# Patient Record
Sex: Male | Born: 1980 | Hispanic: Yes | Marital: Married | Smoking: Never smoker
Health system: Southern US, Community
[De-identification: ages and names within clinical notes are randomized; demographics above are authoritative.]

## PROBLEM LIST (undated history)

## (undated) DIAGNOSIS — K8020 Calculus of gallbladder without cholecystitis without obstruction: Secondary | ICD-10-CM

## (undated) HISTORY — DX: Calculus of gallbladder without cholecystitis without obstruction: K80.20

## (undated) HISTORY — PX: NO PAST SURGERIES: SHX2092

---

## 2018-09-21 LAB — HM HIV SCREENING LAB: HM HIV Screening: NEGATIVE

## 2019-01-19 ENCOUNTER — Ambulatory Visit: Payer: Self-pay

## 2019-01-19 ENCOUNTER — Other Ambulatory Visit: Payer: Self-pay

## 2019-02-05 ENCOUNTER — Other Ambulatory Visit: Payer: Self-pay

## 2019-02-05 ENCOUNTER — Ambulatory Visit: Payer: Self-pay | Admitting: Nurse Practitioner

## 2019-02-05 DIAGNOSIS — Z202 Contact with and (suspected) exposure to infections with a predominantly sexual mode of transmission: Secondary | ICD-10-CM

## 2019-02-05 MED ORDER — METRONIDAZOLE 500 MG PO TABS: 2000.0000 mg | ORAL_TABLET | Freq: Once | ORAL | 0 refills | Status: AC

## 2019-02-05 MED ORDER — METRONIDAZOLE 500 MG PO TABS: 2000.0000 mg | ORAL_TABLET | Freq: Once | ORAL | Status: DC

## 2019-02-05 NOTE — Progress Notes (Signed)
STI clinic/screening visit  Subjective:  Maxwell Stephens is a 38 y.o. male being seen today for an STI screening visit. The patient reports they do not have symptoms.  Patient has the following medical conditions:  There are no active problems to display for this patient.    Chief Complaint  Patient presents with  . Exposure to STD    exposure to [place] from pregnant wife    Referred by wife who is pregnant - unsure of what she was treated for - went back home to obtain card for treatment - exposure to Trich  Any questions or concerns before we get started - at length discussion with client concerning the importance of know the actual treatment and exposure.  Client asked the following questions for STD screening: however declines to answer questions at this time - states that he just wants treatment  Allergies: NKDA Contact to STD - Trich Last sex  - denies having sex with wife       Patient reports - contact of Trich  See flowsheet for further details and programmatic requirements.    The following portions of the patient's history were reviewed and updated as appropriate: allergies, current medications, past medical history, past social history, past surgical history and problem list.  Objective:  There were no vitals filed for this visit.  Physical Exam Vitals signs reviewed.  Constitutional:      Appearance: Normal appearance. He is well-developed and normal weight.  Pulmonary:     Effort: Pulmonary effort is normal.  Skin:    General: Skin is warm and dry.  Neurological:     Mental Status: He is alert.  Psychiatric:        Behavior: Behavior is cooperative.       Assessment and Plan:  Maxwell Stephens is a 38 y.o. male presenting to the [facility] Department for treatment as contact to [place]  1. Trichomonas contact - brought in information concerning Trich exposure Client given  - metroNIDAZOLE (FLAGYL) tablet 2,000 mg Advised to not have sex  for 7 days after partner treated Advised no ETOH while taking medication - also advised to take with food. Client verbalizes understanding and is in agreement with plan of care  Return for PRN.  No future appointments.  Berniece Andreas, NP

## 2019-07-30 NOTE — Progress Notes (Signed)
Chart reviewed by Pharmacist  Suzanne Walker PharmD, Contract Pharmacist at [facility]  

## 2019-08-29 ENCOUNTER — Ambulatory Visit: Payer: Self-pay | Attending: Internal Medicine

## 2019-08-29 DIAGNOSIS — Z23 Encounter for immunization: Secondary | ICD-10-CM

## 2019-08-29 NOTE — Progress Notes (Signed)
   Covid-19 Vaccination Clinic  Name:  Lowen Barringer    MRN: [id number] DOB: [birth year]  08/29/2019  Mr. Mans was observed post Covid-19 immunization for 15 minutes without incident. He was provided with Vaccine Information Sheet and instruction to access the V-Safe system.   Mr. Wilkie was instructed to call 911 with any severe reactions post vaccine: Marland Kitchen Difficulty breathing  . Swelling of face and throat  . A fast heartbeat  . A bad rash all over body  . Dizziness and weakness   Immunizations Administered    Name Date Dose VIS Date Route   Pfizer COVID-19 Vaccine 08/29/2019 10:52 AM 0.3 mL 05/21/2019 Intramuscular   Manufacturer: [employer], [employer]   Lot: [id number]   NDC: [address]-6

## 2019-09-19 ENCOUNTER — Ambulatory Visit: Payer: Self-pay | Attending: Internal Medicine

## 2019-09-19 DIAGNOSIS — Z23 Encounter for immunization: Secondary | ICD-10-CM

## 2019-09-19 NOTE — Progress Notes (Signed)
   Covid-19 Vaccination Clinic  Name:  Maxwell Stephens    MRN: [id number] DOB: 1981-04-16  09/19/2019  [name] was observed post Covid-19 immunization for 15 minutes without incident. He was provided with Vaccine Information Sheet and instruction to access the V-Safe system.   [name] was instructed to call 911 with any severe reactions post vaccine: Marland Kitchen Difficulty breathing  . Swelling of face and throat  . A fast heartbeat  . A bad rash all over body  . Dizziness and weakness   Immunizations Administered    Name Date Dose VIS Date Route   Pfizer COVID-19 Vaccine 09/19/2019 10:22 AM 0.3 mL 05/21/2019 Intramuscular   Manufacturer: [employer], [employer]   Lot: [id number]   NDC: [address]-9

## 2020-04-16 ENCOUNTER — Other Ambulatory Visit: Payer: Self-pay

## 2020-04-16 ENCOUNTER — Emergency Department
Admission: EM | Admit: 2020-04-16 | Discharge: 2020-04-16 | Disposition: A | Discharge status: Home / Self Care | Payer: Self-pay | Attending: Emergency Medicine | Admitting: Emergency Medicine

## 2020-04-16 ENCOUNTER — Emergency Department: Payer: Self-pay

## 2020-04-16 DIAGNOSIS — K8020 Calculus of gallbladder without cholecystitis without obstruction: Secondary | ICD-10-CM | POA: Insufficient documentation

## 2020-04-16 DIAGNOSIS — K760 Fatty (change of) liver, not elsewhere classified: Secondary | ICD-10-CM | POA: Insufficient documentation

## 2020-04-16 DIAGNOSIS — E8889 Other specified metabolic disorders: Secondary | ICD-10-CM

## 2020-04-16 DIAGNOSIS — E7889 Other lipoprotein metabolism disorders: Secondary | ICD-10-CM

## 2020-04-16 DIAGNOSIS — R1011 Right upper quadrant pain: Secondary | ICD-10-CM

## 2020-04-16 LAB — HEPATIC FUNCTION PANEL
ALT: 59 U/L — ABNORMAL HIGH (ref 0–44)
AST: 24 U/L (ref 15–41)
Albumin: 3.8 g/dL (ref 3.5–5.0)
Alkaline Phosphatase: 75 U/L (ref 38–126)
Bilirubin, Direct: <0.1 mg/dL (ref 0.0–0.2)
Total Bilirubin: 0.7 mg/dL (ref 0.3–1.2)
Total Protein: 6.7 g/dL (ref 6.5–8.1)

## 2020-04-16 LAB — BASIC METABOLIC PANEL
Anion gap: 8 (ref 5–15)
BUN: 24 mg/dL — ABNORMAL HIGH (ref 6–20)
CO2: 25 mmol/L (ref 22–32)
Calcium: 8.8 mg/dL — ABNORMAL LOW (ref 8.9–10.3)
Chloride: 105 mmol/L (ref 98–111)
Creatinine, Ser: 0.84 mg/dL (ref 0.61–1.24)
GFR, Estimated: >60 mL/min (ref >60)
Glucose, Bld: 104 mg/dL — ABNORMAL HIGH (ref 70–99)
Potassium: 3.7 mmol/L (ref 3.5–5.1)
Sodium: 138 mmol/L (ref 135–145)

## 2020-04-16 LAB — URINALYSIS, COMPLETE (UACMP) WITH MICROSCOPIC
Bacteria, UA: NONE SEEN
Bilirubin Urine: NEGATIVE
Glucose, UA: NEGATIVE mg/dL
Hgb urine dipstick: NEGATIVE
Ketones, ur: NEGATIVE mg/dL
Leukocytes,Ua: NEGATIVE
Nitrite: NEGATIVE
Protein, ur: NEGATIVE mg/dL
Specific Gravity, Urine: 1.017 (ref 1.005–1.030)
Squamous Epithelial / HPF: NONE SEEN (ref 0–5)
pH: 5 (ref 5.0–8.0)

## 2020-04-16 LAB — LIPASE, BLOOD: Lipase: 32 U/L (ref 11–51)

## 2020-04-16 LAB — CBC
HCT: 44.7 % (ref 39.0–52.0)
Hemoglobin: 14.9 g/dL (ref 13.0–17.0)
MCH: 29 pg (ref 26.0–34.0)
MCHC: 33.3 g/dL (ref 30.0–36.0)
MCV: 87.1 fL (ref 80.0–100.0)
Platelets: 247 10*3/uL (ref 150–400)
RBC: 5.13 MIL/uL (ref 4.22–5.81)
RDW: 12.7 % (ref 11.5–15.5)
WBC: 9.3 10*3/uL (ref 4.0–10.5)
nRBC: 0 % (ref 0.0–0.2)

## 2020-04-16 MED ORDER — PANTOPRAZOLE SODIUM 40 MG PO TBEC
40.0000 mg | DELAYED_RELEASE_TABLET | Freq: Once | ORAL | Status: AC
  Administered 2020-04-16: 40 mg via ORAL
  Filled 2020-04-16: qty 1

## 2020-04-16 MED ORDER — ALUM & MAG HYDROXIDE-SIMETH 200-200-20 MG/5ML PO SUSP
30.0000 mL | Freq: Once | ORAL | Status: AC
  Administered 2020-04-16: 30 mL via ORAL
  Filled 2020-04-16: qty 30

## 2020-04-16 NOTE — ED Notes (Signed)
Paper consent signed for discharge. 

## 2020-04-16 NOTE — ED Notes (Signed)
Ultrasound at besdide.

## 2020-04-16 NOTE — ED Notes (Signed)
EDP at bedside  

## 2020-04-16 NOTE — ED Notes (Signed)
Pt presents with abdo pain R side and R back, nonradiating. States that has pain after eats and feels acid reflux and "a ball" on R side abdo. Usually has BM daily. LBM was this morning. Denies pain, burning with urination. Denies N/V/D. Pt in NAD at this time.

## 2020-04-16 NOTE — ED Provider Notes (Signed)
[facility] Emergency Department Provider Note  ____________________________________________   First MD Initiated Contact with Patient 04/16/20 [phone]     (approximate)  I have reviewed the triage vital signs and the nursing notes.   HISTORY  Chief Complaint Flank Pain   HPI Maxwell Stephens is a 39 y.o. male without significant past medical history who presents for assessment of intermittent episodes of right upper quadrant Donnell pain rating to his back and right shoulder that have been present over the past year but seemingly becoming more frequent.  Patient states he feels episodes are worse after meals.  He denies any NSAID use or regular EtOH use.  He states he is coming emergency room not because his symptoms have changed over the last couple of days but he feels they've been getting more frequent over the past couple of months and not improving.  He states he feels a little bit weak now and then but denies any other acute focal symptoms including earache, vision changes, vertigo, chest pain, cough, shortness of breath, vomiting, diarrhea, dysuria, rash, focal extremity weakness numbness or tingling, recent traumatic injuries, or other acute complaints.  No prior similar episodes.  No alleviating aggravating factors.         No past medical history on file.  There are no problems to display for this patient.     Prior to Admission medications   Medication Sig Start Date End Date Taking? Authorizing Provider  cyclobenzaprine (FLEXERIL) 10 MG tablet cyclobenzaprine 10 mg tablet  Take 1 tablet 3 times a day by oral route.    [provider]  methocarbamol (ROBAXIN) 500 MG tablet Robaxin 500 mg tablet  1 PO BID    [provider]  naproxen (NAPROSYN) 500 MG tablet naproxen 500 mg tablet    [provider]  traMADol (ULTRAM) 50 MG tablet tramadol 50 mg tablet  Take 1 tablet every 6 hours by oral route.    [provider]    Allergies Other  No family history on file.  Social History Social History   Tobacco Use  . Smoking status: Not on file  Substance Use Topics  . Alcohol use: Not on file  . Drug use: Not on file    Review of Systems  Review of Systems  Constitutional: Negative for chills and fever.  HENT: Negative for sore throat.   Eyes: Negative for pain.  Respiratory: Negative for cough and stridor.   Cardiovascular: Negative for chest pain.  Gastrointestinal: Positive for abdominal pain. Negative for blood in stool, diarrhea and vomiting.  Genitourinary: Negative for dysuria.  Musculoskeletal: Negative for myalgias.  Skin: Negative for rash.  Neurological: Negative for seizures, loss of consciousness and headaches.  Psychiatric/Behavioral: Negative for suicidal ideas.  All other systems reviewed and are negative.     ____________________________________________   PHYSICAL EXAM:  VITAL SIGNS: ED Triage Vitals  Enc Vitals Group     BP 04/16/20 0406 (!) 145/84     Pulse Rate 04/16/20 0406 (!) 56     Resp 04/16/20 0406 16     Temp 04/16/20 0406 98 F (36.7 C)     Temp Source 04/16/20 0406 Oral     SpO2 04/16/20 0406 96 %     Weight 04/16/20 0405 165 lb (74.8 kg)     Height 04/16/20 0405 5\' 5"  (1.651 m)     Head Circumference --      Peak Flow --      Pain  Score --      Pain Loc --      Pain Edu? --      Excl. in GC? --    Vitals:   04/16/20 0406 04/16/20 0809  BP: (!) 145/84 133/88  Pulse: (!) 56 62  Resp: 16 16  Temp: 98 F (36.7 C)   SpO2: 96% 100%   Physical Exam Vitals and nursing note reviewed.  Constitutional:      Appearance: He is well-developed.  HENT:     Head: Normocephalic and atraumatic.     Right Ear: External ear normal.     Left Ear: External ear normal.     Nose: Nose normal.     Mouth/Throat:     Mouth: Mucous membranes are moist.  Eyes:     Conjunctiva/sclera: Conjunctivae normal.  Cardiovascular:     Rate and Rhythm: Normal  rate and regular rhythm.     Heart sounds: No murmur heard.   Pulmonary:     Effort: Pulmonary effort is normal. No respiratory distress.     Breath sounds: Normal breath sounds.  Abdominal:     Palpations: Abdomen is soft.     Tenderness: There is no abdominal tenderness.  Musculoskeletal:     Cervical back: Neck supple.  Skin:    General: Skin is warm and dry.  Neurological:     Mental Status: He is alert and oriented to person, place, and time.  Psychiatric:        Mood and Affect: Mood normal.      ____________________________________________   LABS (all labs ordered are listed, but only abnormal results are displayed)  Labs Reviewed  URINALYSIS, COMPLETE (UACMP) WITH MICROSCOPIC - Abnormal; Notable for the following components:      Result Value   Color, Urine YELLOW (*)    APPearance CLEAR (*)    All other components within normal limits  BASIC METABOLIC PANEL - Abnormal; Notable for the following components:   Glucose, Bld 104 (*)    BUN 24 (*)    Calcium 8.8 (*)    All other components within normal limits  HEPATIC FUNCTION PANEL - Abnormal; Notable for the following components:   ALT 59 (*)    All other components within normal limits  CBC  LIPASE, BLOOD  H. PYLORI ANTIGEN, STOOL   ____________________________________________  EKG  Sinus bradycardia with a ventricular rate of 52, axis deviation and no evidence of acute ischemia or other significant underlying arrhythmia.  Unremarkable intervals. ____________________________________________  RADIOLOGY  ED MD interpretation: Cholelithiasis without evidence of cholecystitis or dilation of the common bile duct.  Official radiology report(s): US ABDOMEN LIMITED RUQ (LIVER/GB)  Result Date: 04/16/2020 CLINICAL DATA:  Right upper quadrant pain for 2 days. EXAM: ULTRASOUND ABDOMEN LIMITED RIGHT UPPER QUADRANT COMPARISON:  None. FINDINGS: Gallbladder: Multiple shadowing gallstones measuring up to 1.8 cm. No  gallbladder wall thickening. No sonographic Murphy sign noted by sonographer. Common bile duct: Diameter: 0.5 cm, within normal limits Liver: No focal lesion identified. Liver parenchymal echogenicity is diffusely increased. Portal vein is patent on color Doppler imaging with normal direction of blood flow towards the liver. Other: None. IMPRESSION: 1.  Cholelithiasis without evidence of acute cholecystitis. 2. Diffusely increased liver parenchymal echogenicity which is a nonspecific finding, most commonly seen with hepatic steatosis. Electronically Signed   By: Emmaline Kluver M.D.   On: 04/16/2020 09:14    ____________________________________________   PROCEDURES  Procedure(s) performed (including Critical Care):  Procedures   ____________________________________________  INITIAL IMPRESSION / ASSESSMENT AND PLAN / ED COURSE         Patient presents with [place] to history exam for assessment of him and episodes of right upper quadrant pain that have been increasing in frequency and are associated with meals.  No other clear alleviating or aggravating factors.  Patient is afebrile hemodynamically stable arrival.  Low suspicion for ACS given description of symptoms and patient denying any chest pain with reassuring EKG.  Lipase not consistent with acute pancreatitis.  There is no blood in the urine and description of symptoms is otherwise not consistent with acute symptomatic nephrolithiasis.  No fever or CVA tenderness or other urinary symptoms to suggest pyelonephritis.  Right upper quadrant ultrasound with evidence of cholelithiasis without evidence of Coley cystitis or choledocholithiasis.  Symptoms are very consistent with peptic ulcer disease versus gastritis versus symptomatic cholelithiasis.  I discussed this with the patient and discussed recommendation for outpatient follow-up with general surgery who may recommend a cholecystectomy.  Also discussed that patient must follow-up with his  primary care provider as he may be having some gastritis and peptic ulcer disease as well and his primary care doctor can follow-up his H. pylori test.  I suspect patient is more likely having symptomatic cholelithiasis than peptic ulcer disease given stones on right upper quadrant ultrasound.  H. pylori studies sent.  Patient given Protonix and Maalox.  Advised patient to follow-up with PCP.  Patient discharged in stable condition.  Strict precautions advised and discussed.   ____________________________________________   FINAL CLINICAL IMPRESSION(S) / ED DIAGNOSES  Final diagnoses:  RUQ pain  Calculus of gallbladder without cholecystitis without obstruction  Steatosis    Medications  alum & mag hydroxide-simeth (MAALOX/MYLANTA) 200-200-20 MG/5ML suspension 30 mL (30 mLs Oral Given 04/16/20 0843)  pantoprazole (PROTONIX) EC tablet 40 mg (40 mg Oral Given 04/16/20 0843)     ED Discharge Orders    None       Note:  This document was prepared using Dragon voice recognition software and may include unintentional dictation errors.   Gilles Chiquito, MD 04/16/20 [phone]

## 2020-04-16 NOTE — ED Triage Notes (Signed)
Patient reports right flank pain that radiates into abdomen.

## 2020-04-19 LAB — H. PYLORI ANTIGEN, STOOL: H. Pylori Stool Ag, Eia: POSITIVE — AB

## 2020-05-01 ENCOUNTER — Ambulatory Visit: Payer: Self-pay | Admitting: Surgery

## 2020-06-04 ENCOUNTER — Emergency Department
Admission: EM | Admit: 2020-06-04 | Discharge: 2020-06-04 | Disposition: A | Discharge status: Home / Self Care | Payer: Self-pay | Attending: Emergency Medicine | Admitting: Emergency Medicine

## 2020-06-04 ENCOUNTER — Other Ambulatory Visit: Payer: Self-pay

## 2020-06-04 ENCOUNTER — Encounter: Payer: Self-pay | Admitting: Emergency Medicine

## 2020-06-04 ENCOUNTER — Emergency Department: Payer: Self-pay

## 2020-06-04 DIAGNOSIS — R1011 Right upper quadrant pain: Secondary | ICD-10-CM | POA: Insufficient documentation

## 2020-06-04 DIAGNOSIS — K8051 Calculus of bile duct without cholangitis or cholecystitis with obstruction: Secondary | ICD-10-CM | POA: Insufficient documentation

## 2020-06-04 DIAGNOSIS — K8050 Calculus of bile duct without cholangitis or cholecystitis without obstruction: Secondary | ICD-10-CM

## 2020-06-04 DIAGNOSIS — R110 Nausea: Secondary | ICD-10-CM | POA: Insufficient documentation

## 2020-06-04 DIAGNOSIS — R1010 Upper abdominal pain, unspecified: Secondary | ICD-10-CM

## 2020-06-04 LAB — COMPREHENSIVE METABOLIC PANEL
ALT: 61 U/L — ABNORMAL HIGH (ref 0–44)
AST: 30 U/L (ref 15–41)
Albumin: 3.9 g/dL (ref 3.5–5.0)
Alkaline Phosphatase: 57 U/L (ref 38–126)
Anion gap: 10 (ref 5–15)
BUN: 23 mg/dL — ABNORMAL HIGH (ref 6–20)
CO2: 24 mmol/L (ref 22–32)
Calcium: 8.9 mg/dL (ref 8.9–10.3)
Chloride: 102 mmol/L (ref 98–111)
Creatinine, Ser: 0.76 mg/dL (ref 0.61–1.24)
GFR, Estimated: >60 mL/min (ref >60)
Glucose, Bld: 154 mg/dL — ABNORMAL HIGH (ref 70–99)
Potassium: 3.6 mmol/L (ref 3.5–5.1)
Sodium: 136 mmol/L (ref 135–145)
Total Bilirubin: 0.7 mg/dL (ref 0.3–1.2)
Total Protein: 7.2 g/dL (ref 6.5–8.1)

## 2020-06-04 LAB — CBC
HCT: 45.1 % (ref 39.0–52.0)
Hemoglobin: 15.2 g/dL (ref 13.0–17.0)
MCH: 28.9 pg (ref 26.0–34.0)
MCHC: 33.7 g/dL (ref 30.0–36.0)
MCV: 85.7 fL (ref 80.0–100.0)
Platelets: 250 10*3/uL (ref 150–400)
RBC: 5.26 MIL/uL (ref 4.22–5.81)
RDW: 12 % (ref 11.5–15.5)
WBC: 10.8 10*3/uL — ABNORMAL HIGH (ref 4.0–10.5)
nRBC: 0 % (ref 0.0–0.2)

## 2020-06-04 LAB — LIPASE, BLOOD: Lipase: 27 U/L (ref 11–51)

## 2020-06-04 MED ORDER — ONDANSETRON HCL 4 MG/2ML IJ SOLN
4.0000 mg | Freq: Once | INTRAMUSCULAR | Status: AC
  Administered 2020-06-04: 04:00:00 4 mg via INTRAVENOUS
  Filled 2020-06-04: qty 2

## 2020-06-04 MED ORDER — HYDROCODONE-ACETAMINOPHEN 5-325 MG PO TABS
1.0000 | ORAL_TABLET | ORAL | 0 refills | Status: DC | PRN
Start: 2020-06-04 — End: 2020-10-12

## 2020-06-04 MED ORDER — KETOROLAC TROMETHAMINE 30 MG/ML IJ SOLN
30.0000 mg | Freq: Once | INTRAMUSCULAR | Status: AC
  Administered 2020-06-04: 04:00:00 30 mg via INTRAVENOUS
  Filled 2020-06-04: qty 1

## 2020-06-04 NOTE — ED Triage Notes (Signed)
Pt arrived via POV with reports of abd pain that started around 7pm, pt unable to sit still in triage, pt states he has been taking OTC meds for colon health with no relief.  Pt here at the beginning of Nov dx cholelithiasis, pt states pain is worse.  Pt also reports some constipation as well, had small BM today but states it was difficult to come out.

## 2020-06-04 NOTE — ED Provider Notes (Signed)
[facility] Emergency Department Provider Note   ____________________________________________    I have reviewed the triage vital signs and the nursing notes.   HISTORY  Chief Complaint Abdominal Pain     HPI Maxwell Stephens is a 39 y.o. male who presents with complaints of right upper quadrant abdominal pain.  Patient reports he has had this before and was related to his gallbladder.  He describes sharp pain with some radiation to his back.  Positive nausea no vomiting.  No fevers or chills.  Has not taken anything for this.  No history of abdominal surgery  Past Medical History:  Diagnosis Date   Cholelithiasis     There are no problems to display for this patient.   History reviewed. No pertinent surgical history.  Prior to Admission medications   Medication Sig Start Date End Date Taking? Authorizing Provider  cyclobenzaprine (FLEXERIL) 10 MG tablet cyclobenzaprine 10 mg tablet  Take 1 tablet 3 times a day by oral route.    [provider]  HYDROcodone-acetaminophen (NORCO/VICODIN) 5-325 MG tablet Take 1 tablet by mouth every 4 (four) hours as needed for moderate pain. 06/04/20   Jene Every, MD  methocarbamol (ROBAXIN) 500 MG tablet Robaxin 500 mg tablet  1 PO BID    [provider]  naproxen (NAPROSYN) 500 MG tablet naproxen 500 mg tablet    [provider]  traMADol (ULTRAM) 50 MG tablet tramadol 50 mg tablet  Take 1 tablet every 6 hours by oral route.    [provider]     Allergies Other  No family history on file.  Social History Social History   Tobacco Use   Smoking status: Never Smoker   Smokeless tobacco: Never Used  Building services engineer Use: Never used    Review of Systems  Constitutional: No fever/chills Eyes: No visual changes.  ENT: No sore throat. Cardiovascular: Denies chest pain. Respiratory: Denies shortness of breath. Gastrointestinal: As above Genitourinary:  Negative for dysuria. Musculoskeletal: Negative for back pain. Skin: Negative for rash. Neurological: Negative for headaches or weakness   ____________________________________________   PHYSICAL EXAM:  VITAL SIGNS: ED Triage Vitals  Enc Vitals Group     BP 06/04/20 0237 135/78     Pulse Rate 06/04/20 0237 64     Resp 06/04/20 0237 (!) 22     Temp 06/04/20 0237 98.6 F (37 C)     Temp Source 06/04/20 0237 Oral     SpO2 06/04/20 0237 98 %     Weight 06/04/20 0232 74.8 kg (164 lb 14.5 oz)     Height 06/04/20 0232 1.651 m (5\' 5" )     Head Circumference --      Peak Flow --      Pain Score 06/04/20 0232 10     Pain Loc --      Pain Edu? --      Excl. in GC? --     Constitutional: Alert and oriented  Nose: No congestion/rhinnorhea. Mouth/Throat: Mucous membranes are moist.   Neck:  Painless ROM Cardiovascular: Normal rate, regular rhythm. Grossly normal heart sounds.  Good peripheral circulation. Respiratory: Normal respiratory effort.  No retractions. Lungs CTAB. Gastrointestinal: Soft, mild tenderness right upper quadrant, no distention, no CVA tenderness  Musculoskeletal: No lower extremity tenderness nor edema.  Warm and well perfused Neurologic:  Normal speech and language. No gross focal neurologic deficits are appreciated.  Skin:  Skin is warm, dry and intact. No rash noted.  Psychiatric: Mood and affect are normal. Speech and behavior are normal.  ____________________________________________   LABS (all labs ordered are listed, but only abnormal results are displayed)  Labs Reviewed  COMPREHENSIVE METABOLIC PANEL - Abnormal; Notable for the following components:      Result Value   Glucose, Bld 154 (*)    BUN 23 (*)    ALT 61 (*)    All other components within normal limits  CBC - Abnormal; Notable for the following components:   WBC 10.8 (*)    All other components within normal limits  LIPASE, BLOOD  URINALYSIS, COMPLETE (UACMP) WITH MICROSCOPIC    ____________________________________________  EKG  ED ECG REPORT I, Jene Every, the attending physician, personally viewed and interpreted this ECG.  Date: 06/04/2020  Rhythm: normal sinus rhythm QRS Axis: normal Intervals: normal ST/T Wave abnormalities: normal Narrative Interpretation: no evidence of acute ischemia  ____________________________________________  RADIOLOGY  Ultrasound reviewed by me, large gallstone noted ____________________________________________   PROCEDURES  Procedure(s) performed: No  Procedures   Critical Care performed: No ____________________________________________   INITIAL IMPRESSION / ASSESSMENT AND PLAN / ED COURSE  Pertinent labs & imaging results that were available during my care of the patient were reviewed by me and considered in my medical decision making (see chart for details).  Patient with ruq abdominal pain with mild ttp. Suspicious for biliary colic vs cholecystitis. Labs overall reassuring. Will treat with IV toradol/IV zofran and obtain US   [place] without evidence of cholecystitis. Multiple gallstones noted.   Patient pain free after toradol.   Appropriate for d/c at this time with outpatient follow up with gen surgery    ____________________________________________   FINAL CLINICAL IMPRESSION(S) / ED DIAGNOSES  Final diagnoses:  Upper abdominal pain  Biliary colic        Note:  This document was prepared using Dragon voice recognition software and may include unintentional dictation errors.   Jene Every, MD 06/04/20 [phone]

## 2020-06-13 ENCOUNTER — Ambulatory Visit: Payer: Self-pay | Admitting: Surgery

## 2020-06-15 ENCOUNTER — Ambulatory Visit: Payer: Self-pay | Admitting: Surgery

## 2020-09-05 ENCOUNTER — Ambulatory Visit: Payer: Self-pay | Admitting: Surgery

## 2020-09-27 ENCOUNTER — Ambulatory Visit (INDEPENDENT_AMBULATORY_CARE_PROVIDER_SITE_OTHER): Payer: Managed Care, Other (non HMO) | Admitting: Surgery

## 2020-09-27 ENCOUNTER — Other Ambulatory Visit: Payer: Self-pay

## 2020-09-27 ENCOUNTER — Encounter: Payer: Self-pay | Admitting: Surgery

## 2020-09-27 VITALS — BP 114/78 | HR 66 | Temp 98.3°F | Ht 65.0 in | Wt 167.0 lb

## 2020-09-27 DIAGNOSIS — K8020 Calculus of gallbladder without cholecystitis without obstruction: Secondary | ICD-10-CM

## 2020-09-27 NOTE — Progress Notes (Signed)
09/27/2020  Reason for Visit:  Symptomatic cholelithiasis  History of Present Illness: Maxwell Stephens is a 40 y.o. male presenting for evaluation of symptomatic cholelithiasis.  The patient had been to the ED twice in November and December of 2021 with RUQ pain, associated with nausea, vomiting, and with pain radiating to the back.  He had ultrasounds which showed cholelithiasis but no cholecystitis.  The patient reports that he has not had much episodes since then.  He has made dietary modifications but he still has minor episodes from time to time.  He recently had an episode after eating pork.  He also reports having some bloatedness after eating bread or rice, but this is only intermittently and not with every bread/rice meal.  Past Medical History: Past Medical History:  Diagnosis Date  . Cholelithiasis      Past Surgical History: --No prior surgeries  Home Medications: Prior to Admission medications   Medication Sig Start Date End Date Taking? Authorizing Provider  cyclobenzaprine (FLEXERIL) 10 MG tablet cyclobenzaprine 10 mg tablet  Take 1 tablet 3 times a day by oral route.   Yes [provider]  HYDROcodone-acetaminophen (NORCO/VICODIN) 5-325 MG tablet Take 1 tablet by mouth every 4 (four) hours as needed for moderate pain. 06/04/20  Yes Lavonia Drafts, MD  methocarbamol (ROBAXIN) 500 MG tablet Robaxin 500 mg tablet  1 PO BID   Yes [provider]  naproxen (NAPROSYN) 500 MG tablet naproxen 500 mg tablet   Yes [provider]  traMADol (ULTRAM) 50 MG tablet tramadol 50 mg tablet  Take 1 tablet every 6 hours by oral route.   Yes [provider]    Allergies: No Known Allergies  Social History:  reports that he has never smoked. He has never used smokeless tobacco. He reports current alcohol use. He reports that he does not use drugs.   Family History: Family History  Problem Relation Age of Onset  . Diabetes Mother   . Aneurysm  Maternal Grandmother     Review of Systems: Review of Systems  Constitutional: Negative for chills and fever.  HENT: Negative for hearing loss.   Respiratory: Negative for shortness of breath.   Cardiovascular: Negative for chest pain.  Gastrointestinal: Positive for abdominal pain, nausea and vomiting.  Genitourinary: Negative for dysuria.  Musculoskeletal: Negative for myalgias.  Skin: Negative for rash.  Neurological: Negative for dizziness.  Psychiatric/Behavioral: Negative for depression.    Physical Exam BP 114/78   Pulse 66   Temp 98.3 F (36.8 C)   Ht 5' 5"  (1.651 m)   Wt 167 lb (75.8 kg)   SpO2 96%   BMI 27.79 kg/m  CONSTITUTIONAL: No acute distress, well nourished HEENT:  Normocephalic, atraumatic, extraocular motion intact. NECK: Trachea is midline, and there is no jugular venous distension.  RESPIRATORY:  Lungs are clear, and breath sounds are equal bilaterally. Normal respiratory effort without pathologic use of accessory muscles. CARDIOVASCULAR: Heart is regular without murmurs, gallops, or rubs. GI: The abdomen is soft, non-distended, currently non-tender to palpation. No palpable hernias. MUSCULOSKELETAL:  Normal muscle strength and tone in all four extremities.  No peripheral edema or cyanosis. SKIN: Skin turgor is normal. There are no pathologic skin lesions.  NEUROLOGIC:  Motor and sensation is grossly normal.  Cranial nerves are grossly intact. PSYCH:  Alert and oriented to person, place and time. Affect is normal.  Laboratory Analysis: Labs from 06/04/20: Na 136, K 3.6, Cl 102, CO2 24, BUN 23, Cr 0.76.  Total bili  0.7, AST 30, ALT 61, Alk Phos 57.  WBC 10.8, Hgb 15.2, Hct 45.1, Plt 250  Imaging: U/S RUQ 06/04/20: IMPRESSION: Cholelithiasis.  No sonographic findings of acute cholecystitis.   Assessment and Plan: This is a 39 y.o. male with symptomatic cholelithiasis.  --Discussed with the patient the function/role of the gallbladder and how some  of his food choices can contribute to episodes of biliary colic.  These have improved after dietary modifications, but he still has some minor symptoms.  He did have a smaller episode recently after eating pork and he's also had some issues with pizza in the recent past.  He also reports he feels bloated after eating bread or rice sometimes.  Although this is less of a trigger for gallbladder issues, it would be unlikely to be gluten/celiac issues if it only happens sometimes and not with each po intake of bread/rice, and does not happen with other food options.  I do think there's a component to his symptoms coming from his gallbladder, and would recommend proceeding with cholecystectomy.  Discussed the role of robotic cholecystectomy including risks of bleeding, infection, and injury to surrounding structures, post-op activity restrictions and recovery. --At this point, the patient is uncertain about surgery and also about timing.  He will check with his job to see when would be the better time to be off temporarily for surgery and he will call us back to schedule surgery.  He is aware that if the timing is more than 30 days, would need new visit for H&P update.  Face-to-face time spent with the patient and care providers was 60 minutes, with more than 50% of the time spent counseling, educating, and coordinating care of the patient.     Maxwell Stephens, [facility]

## 2020-09-27 NOTE — H&P (View-Only) (Signed)
09/27/2020  Reason for Visit:  Symptomatic cholelithiasis  History of Present Illness: Maxwell Stephens is a 40 y.o. male presenting for evaluation of symptomatic cholelithiasis.  The patient had been to the ED twice in November and December of 2021 with RUQ pain, associated with nausea, vomiting, and with pain radiating to the back.  He had ultrasounds which showed cholelithiasis but no cholecystitis.  The patient reports that he has not had much episodes since then.  He has made dietary modifications but he still has minor episodes from time to time.  He recently had an episode after eating pork.  He also reports having some bloatedness after eating bread or rice, but this is only intermittently and not with every bread/rice meal.  Past Medical History: Past Medical History:  Diagnosis Date  . Cholelithiasis      Past Surgical History: --No prior surgeries  Home Medications: Prior to Admission medications   Medication Sig Start Date End Date Taking? Authorizing Provider  cyclobenzaprine (FLEXERIL) 10 MG tablet cyclobenzaprine 10 mg tablet  Take 1 tablet 3 times a day by oral route.   Yes [provider]  HYDROcodone-acetaminophen (NORCO/VICODIN) 5-325 MG tablet Take 1 tablet by mouth every 4 (four) hours as needed for moderate pain. 06/04/20  Yes Lavonia Drafts, MD  methocarbamol (ROBAXIN) 500 MG tablet Robaxin 500 mg tablet  1 PO BID   Yes [provider]  naproxen (NAPROSYN) 500 MG tablet naproxen 500 mg tablet   Yes [provider]  traMADol (ULTRAM) 50 MG tablet tramadol 50 mg tablet  Take 1 tablet every 6 hours by oral route.   Yes [provider]    Allergies: No Known Allergies  Social History:  reports that he has never smoked. He has never used smokeless tobacco. He reports current alcohol use. He reports that he does not use drugs.   Family History: Family History  Problem Relation Age of Onset  . Diabetes Mother   . Aneurysm  Maternal Grandmother     Review of Systems: Review of Systems  Constitutional: Negative for chills and fever.  HENT: Negative for hearing loss.   Respiratory: Negative for shortness of breath.   Cardiovascular: Negative for chest pain.  Gastrointestinal: Positive for abdominal pain, nausea and vomiting.  Genitourinary: Negative for dysuria.  Musculoskeletal: Negative for myalgias.  Skin: Negative for rash.  Neurological: Negative for dizziness.  Psychiatric/Behavioral: Negative for depression.    Physical Exam BP 114/78   Pulse 66   Temp 98.3 F (36.8 C)   Ht 5' 5"  (1.651 m)   Wt 167 lb (75.8 kg)   SpO2 96%   BMI 27.79 kg/m  CONSTITUTIONAL: No acute distress, well nourished HEENT:  Normocephalic, atraumatic, extraocular motion intact. NECK: Trachea is midline, and there is no jugular venous distension.  RESPIRATORY:  Lungs are clear, and breath sounds are equal bilaterally. Normal respiratory effort without pathologic use of accessory muscles. CARDIOVASCULAR: Heart is regular without murmurs, gallops, or rubs. GI: The abdomen is soft, non-distended, currently non-tender to palpation. No palpable hernias. MUSCULOSKELETAL:  Normal muscle strength and tone in all four extremities.  No peripheral edema or cyanosis. SKIN: Skin turgor is normal. There are no pathologic skin lesions.  NEUROLOGIC:  Motor and sensation is grossly normal.  Cranial nerves are grossly intact. PSYCH:  Alert and oriented to person, place and time. Affect is normal.  Laboratory Analysis: Labs from 06/04/20: Na 136, K 3.6, Cl 102, CO2 24, BUN 23, Cr 0.76.  Total bili  0.7, AST 30, ALT 61, Alk Phos 57.  WBC 10.8, Hgb 15.2, Hct 45.1, Plt 250  Imaging: U/S RUQ 06/04/20: IMPRESSION: Cholelithiasis.  No sonographic findings of acute cholecystitis.   Assessment and Plan: This is a 41 y.o. male with symptomatic cholelithiasis.  --Discussed with the patient the function/role of the gallbladder and how some  of his food choices can contribute to episodes of biliary colic.  These have improved after dietary modifications, but he still has some minor symptoms.  He did have a smaller episode recently after eating pork and he's also had some issues with pizza in the recent past.  He also reports he feels bloated after eating bread or rice sometimes.  Although this is less of a trigger for gallbladder issues, it would be unlikely to be gluten/celiac issues if it only happens sometimes and not with each po intake of bread/rice, and does not happen with other food options.  I do think there's a component to his symptoms coming from his gallbladder, and would recommend proceeding with cholecystectomy.  Discussed the role of robotic cholecystectomy including risks of bleeding, infection, and injury to surrounding structures, post-op activity restrictions and recovery. --At this point, the patient is uncertain about surgery and also about timing.  He will check with his job to see when would be the better time to be off temporarily for surgery and he will call us back to schedule surgery.  He is aware that if the timing is more than 30 days, would need new visit for H&P update.  Face-to-face time spent with the patient and care providers was 60 minutes, with more than 50% of the time spent counseling, educating, and coordinating care of the patient.     [name], [facility]

## 2020-09-27 NOTE — Patient Instructions (Signed)
You have requested to have your gallbladder removed. This will be done at [facility] with Dr. Aleen CampiPiscoya.  You will most likely be out of work 1-2 weeks for this surgery. You will return after your post-op appointment with a lifting restriction for approximately 4 more weeks.  You will be able to eat anything you would like to following surgery. But, start by eating a bland diet and advance this as tolerated. The Gallbladder diet is below, please go as closely by this diet as possible prior to surgery to avoid any further attacks.  Please see the (blue)pre-care form that you have been given today. Our surgery scheduler will call you to go over information and verify surgery date.   If you have any questions, please call our office.  Laparoscopic Cholecystectomy Laparoscopic cholecystectomy is surgery to remove the gallbladder. The gallbladder is located in the upper right part of the abdomen, behind the liver. It is a storage sac for bile, which is produced in the liver. Bile aids in the digestion and absorption of fats. Cholecystectomy is often done for inflammation of the gallbladder (cholecystitis). This condition is usually caused by a buildup of gallstones (cholelithiasis) in the gallbladder. Gallstones can block the flow of bile, and that can result in inflammation and pain. In severe cases, emergency surgery may be required. If emergency surgery is not required, you will have time to prepare for the procedure.   Colecistectoma mnimamente invasiva Minimally Invasive Cholecystectomy Una colecistectoma mnimamente invasiva es una ciruga que se realiza para extirpar la vescula biliar. La vescula biliar es un rgano que tiene forma de pera y se encuentra debajo del hgado, del lado derecho del cuerpo. La vescula biliar almacena bilis, un lquido que ayuda al organismo a digerir las grasas. La colecistectoma se realiza con frecuencia para tratar la inflamacin de la vescula biliar  (colecistitis). Esta afeccin normalmente se debe a la acumulacin de clculos biliares (colelitiasis) en la vescula biliar. Estos clculos pueden obstruir el flujo de la bilis, lo que produce inflamacin y Engineer, miningdolor. En los Illinois Tool Workscasos graves, podr ser Bangladeshnecesaria una ciruga de urgencia. Este procedimiento se realiza a travs de pequeas incisiones en el abdomen, en lugar de una incisin grande. Tambin se denomina "ciruga laparoscpica". Se introduce un endoscopio delgado que tiene Secretary/administratoruna cmara (laparoscopio) a travs de una incisin. A travs de las otras incisiones, se introducen los instrumentos quirrgicos. En algunos casos, es posible que [place] ciruga mnimamente invasiva deba cambiarse a una Azerbaijanciruga realizada a travs de una incisin ms grande. Esta se denomina "ciruga abierta". Informe al mdico acerca de lo siguiente:  Cualquier alergia que tenga.  Todos los Chesapeake Energymedicamentos que [place]ausa, incluidos vitaminas, hierbas, gotas oftlmicas, cremas y [address]icamentos de [address] libre.  Cualquier problema previo que usted o algn miembro de su familia hayan tenido con los anestsicos.  Cualquier trastorno de la sangre que tenga.  Cirugas a las que se haya sometido.  Cualquier afeccin mdica que tenga.  Si est embarazada o podra estarlo. Cules son los riesgos? En general, se trata de un procedimiento seguro. Sin embargo, pueden ocurrir complicaciones, por ejemplo:  Infeccin.  Sangrado.  Reacciones alrgicas a los medicamentos.  Daos a las estructuras o los rganos cercanos.  Un clculo que queda en el conducto biliar comn (coldoco). El conducto coldoco transporta la bilis desde la vescula biliar hacia el intestino delgado.  Una filtracin de bilis del conducto cstico que se comprime cuando se extirpa la vescula biliar. Qu ocurre antes del procedimiento? Mantenerse hidratado  Siga las instrucciones del mdico acerca de mantenerse hidratado, las cuales pueden incluir lo siguiente:  Hasta  doshoras antes del procedimiento, puede beber lquidos transparentes, como agua, jugos de fruta sin pulpa, caf negro y t solo.   Medicamentos Consulte al mdico sobre:  Multimedia programmer o suspender los medicamentos que [place] habitualmente. Esto es muy importante si toma medicamentos para la diabetes o anticoagulantes.  Tomar medicamentos como aspirina e ibuprofeno. Estos medicamentos pueden tener un efecto anticoagulante en la [place]. No tome estos medicamentos a menos que el mdico se lo indique.  Usar medicamentos de venta libre, vitaminas, hierbas y suplementos. Instrucciones generales  Infrmele al mdico antes de la ciruga si se ha resfriado o si tiene una infeccin.  Planifique para que alguien lo lleve a su casa desde el hospital o la clnica.  Si se ir a su casa inmediatamente despus del procedimiento, pdale a alguien que se quede con usted durante 24horas.  Pregntele al mdico: ? Cmo se Forensic psychologist de la Leisure centre manager. ? Qu medidas se tomarn para evitar una infeccin. Estas medidas pueden incluir:  Rasurar el vello del lugar de la ciruga.  Lavar la piel con un jabn antisptico.  Suministrar antibiticos. Qu ocurre durante el procedimiento?  Le colocarn una va intravenosa en una vena.  Le administrarn uno de los siguientes medicamentos o ambos: ? Un medicamento para ayudar a relajarse (sedante). ? Un medicamento que lo har dormir (anestesia general).  Le colocarn un tubo en la boca para que pueda respirar.  Su cirujano le har varias incisiones pequeas en el abdomen.  El laparoscopio se introducir a travs de una de las pequeas incisiones. La cmara del laparoscopio enviar imgenes a un monitor que se encuentra en el quirfano. Esto permitir a su Pensions consultant del abdomen.  Le inyectarn un gas en el abdomen. Esto expandir el abdomen para que el cirujano tenga ms lugar para Facilities manager.  El resto del instrumental necesario para el  procedimiento se introducir a travs de las otras incisiones. Se extirpar la vescula biliar a travs de una de las incisiones.  Se puede examinar el conducto coldoco. Si se encuentran clculos en el conducto coldoco, tal vez deban extirparse.  Despus de la extirpacin de la vescula biliar, se cerrarn las incisiones con puntos (suturas), grapas o goma para cerrar la piel.  Las incisiones pueden cubrirse con una venda (vendaje). El procedimiento puede variar segn el mdico y el hospital.   Ladell Heads ocurre despus del procedimiento?  Le controlarn la presin arterial, la frecuencia cardaca, la frecuencia respiratoria y Air cabin crew de oxgeno en la sangre hasta que le den el alta del hospital o la clnica.  Le darn analgsicos para Human resources officer, si es necesario.  Si le administraron un sedante durante el procedimiento, puede afectarlo por varias horas. No conduzca ni opere maquinaria hasta que el mdico le indique que es seguro [place]. Resumen  La colecistectoma mnimamente invasiva, tambin llamada colecistectoma laparoscpica, es una ciruga que se realiza para extirpar la vescula biliar a travs de pequeas incisiones.  Informe a su mdico sobre todas las otras afecciones que tenga y [place] todos los medicamentos que est usando para dichas afecciones.  Antes del procedimiento, siga las indicaciones sobre las restricciones en los alimentos y las bebidas y sobre cambiar o suspender medicamentos.  Si le administraron un sedante durante el procedimiento, puede afectarlo por varias horas. No conduzca ni opere maquinaria hasta que el mdico le indique que es seguro [place].  Esta informacin no tiene Theme park manager el consejo del mdico. Asegrese de hacerle al mdico cualquier pregunta que tenga. Document Revised: 05/20/2019 Document Reviewed: 05/20/2019 Elsevier Patient Education  2021 Elsevier Inc.    Plan de alimentacin para problemas de vescula biliar Gallbladder  Eating Plan Si tiene una afeccin de la vescula biliar, puede tener problemas para digerir las grasas. Consumir una dieta con bajo contenido de grasas puede [employer] sntomas, y puede ser beneficiosa antes y despus de [place] de extraccin de vescula biliar (colecistectoma). El mdico puede recomendarle que trabaje con un especialista en dietas y alimentacin (nutricionista) para que lo ayude a reducir la cantidad de grasas en su dieta. Consejos para seguir este plan [place] generales  Limite el consumo de grasas a menos del 30% del total de caloras diarias. Si usted ingiere alrededor de 1800 caloras diarias, esto es menos de 60 gramos (g) de Automotive engineer.  La grasa es una parte importante de una dieta saludable. Consumir una dieta con bajo contenido de grasas puede dificultar mantener un peso corporal saludable. Pregunte a su nutricionista qu cantidad de grasas, caloras y otros nutrientes necesita diariamente.  Haga comidas pequeas y frecuentes Freight forwarder de tres comidas abundantes.  Beba de 8 a 10 vasos de lquido por [employer]. Beba suficiente lquido como para mantener la orina clara o de color amarillo plido.  Limite el consumo de alcohol a no ms de por da si es mujer y no est [place], y a por da si es hombre. Una medida equivale a 12oz de Financial controller, 5oz de vino o 1oz de bebidas alcohlicas de alta graduacin. Al leer las etiquetas de los alimentos  Consulte la informacin nutricional en las etiquetas de los alimentos para conocer la cantidad de grasas por porcin. Elija alimentos con menos de 3 gramos de grasas por porcin.   Al ir de compras  Elija alimentos saludables sin grasas o con bajo contenido de grasas. Busque las palabras "sin grasa", "bajo en grasas" o "con bajo contenido de grasas".  Evite comprar alimentos procesados o preenvasados. Al cocinar  Para cocinar opte por mtodos con bajo contenido de grasa, como  hornear, hervir, Software engineer y Transport planner.  Cocine con pequeas cantidades de grasas saludables, como aceite de [place], aceite de semilla de [place], aceite de canola o [place]. Qu alimentos se recomiendan?  Todas las frutas y verduras frescas, congeladas o enlatadas.  Cereales integrales.  Leche y yogur semidescremados y descremados.  Vita [place], aves sin piel, pescado, huevos y legumbres.  Suplementos proteicos con bajo contenido de grasas, en polvo o lquidos.  Hierbas y especias. Qu alimentos no se recomiendan?  Alimentos muy grasos. Entre estos se incluyen productos panificados, comida rpida, cortes de carne con grasa, helados, pan francs, rosquillas dulces, pizza, pan de queso, alimentos cubiertos con [place], salsas con crema o queso.  Comidas fritas. Se incluyen papas fritas, tempura, pescado rebozado, milanesas de pollo, panes fritos y dulces.  Alimentos con [employer].  Alimentos que causan gases o meteorismo. Resumen  Una dieta de bajo contenido graso puede ser beneficiosa si tiene una afeccin de la vescula biliar o puede hacerla antes y despus de someterse a una ciruga de vescula.  Limite el consumo de grasas a menos del 30% del total de caloras diarias. Esto es casi 60 gramos de grasa si usted ingiere 1800 caloras diarias.  Haga comidas pequeas y frecuentes Freight forwarder de tres comidas abundantes. Esta  informacin no tiene Theme park manager el consejo del mdico. Asegrese de hacerle al mdico cualquier pregunta que tenga. Document Revised: 03/22/2020 Document Reviewed: 03/22/2020 Elsevier Patient Education  2021 [employer].

## 2020-09-29 ENCOUNTER — Telehealth: Payer: Self-pay | Admitting: Surgery

## 2020-09-29 NOTE — Telephone Encounter (Signed)
Outgoing call is made, left message for patient to call.  Please advise patient of Pre-Admission date/time, COVID Testing date and Surgery date.  Surgery Date: 10/10/20 Preadmission Testing Date: 10/03/20 (phone 8a-1p) Covid Testing Date: Not needed, patient has been fully vaccinated.    Also patient to call at [phone], between 1-3:00pm the day before surgery, to find out what time to arrive for surgery.

## 2020-09-29 NOTE — Telephone Encounter (Signed)
Patient called back and I gave him his full surgery instructions.

## 2020-09-29 NOTE — Addendum Note (Signed)
Addended by: Myrtie Hawk on: 09/29/2020 02:46 PM   Modules accepted: Orders, SmartSet

## 2020-10-03 ENCOUNTER — Encounter
Admission: RE | Admit: 2020-10-03 | Discharge: 2020-10-03 | Disposition: A | Discharge status: Home / Self Care | Payer: Managed Care, Other (non HMO) | Source: Ambulatory Visit | Attending: Surgery | Admitting: Surgery

## 2020-10-03 ENCOUNTER — Other Ambulatory Visit: Payer: Self-pay

## 2020-10-03 NOTE — Patient Instructions (Signed)
Your procedure is scheduled on: Tuesday  10/10/20. Su procedimiento est programado para: Tuesday  10/10/20. Report to Day Surgery. Presntese a: To find out your arrival time please call [phone] between 1PM - 3PM on Monday 10/09/20. Para saber su hora de llegada por favor llame al [phone] entre la 1PM - 3PM el da: Monday 10/09/20.  Remember: Instructions that are not followed completely may result in serious medical risk, up to and including death,  or upon the discretion of your surgeon and anesthesiologist your surgery may need to be rescheduled.  Recuerde: Las instrucciones que no se siguen completamente Armed forces logistics/support/administrative officer en un riesgo de salud grave, incluyendo hasta  la [place] o a discrecin de su cirujano y Scientific laboratory technician, su ciruga se puede posponer.   __X_ 1.Do not eat food after midnight the night before your procedure. No    gum chewing or hard candies. You may drink clear liquids up to 2 hours     before you are scheduled to arrive for your surgery- DO not drink clear     Liquids within 2 hours of the start of your surgery.     Clear Liquids include:    water, apple juice without pulp, clear carbohydrate drink such as    Clearfast of [place], Black Coffee or Tea (Do not add anything to coffee or tea).      No coma nada despus de la medianoche de la noche anterior a su    procedimiento. No coma chicles ni caramelos duros. Puede tomar    lquidos claros hasta 2 horas antes de su hora programada de llegada al     hospital para su procedimiento. No tome lquidos claros durante el     transcurso de las 2 horas de su llegada programada al hospital para su     procedimiento, ya que esto puede llevar a que su procedimiento se    retrase o tenga que volver a Magazine features editor.  Los lquidos claros incluyen:          - Agua o jugo de [place] sin pulpa          - Bebidas claras con carbohidratos como ClearFast o Gatorade          - Caf negro o t claro (sin leche, sin cremas, no  agregue nada al caf ni al t)  No tome nada que no est en esta lista.  Los pacientes con diabetes tipo 1 y tipo 2 solo deben Printmaker.  Llame a la clnica de [facility] o a la unidad de Same Day Surgery si  tiene alguna pregunta sobre estas instrucciones.              ____ 2.Do Not Smoke or use e-cigarettes For 24 Hours Prior to Your Surgery.    Do not use any chewable tobacco products for at least 6   hours prior to surgery.    No fume ni use cigarrillos electrnicos durante las 24 horas previas    a su [place].  No use ningn producto de tabaco masticable durante   al menos 6 horas antes de la [place].     ____ 3. No alcohol for 24 hours before or after surgery.    No tome alcohol durante las 24 horas antes ni despus de la [place].   ____4. Bring all medications with you on the day of surgery if instructed.    Lleve todos los medicamentos con usted el da de su ciruga si se le  ha indicado as.   ____ 5. Notify your doctor if there is any change in your medical condition (cold,fever, infections).    Informe a su mdico si hay algn cambio en su condicin mdica  (resfriado, fiebre, infecciones).   Do not wear jewelry, make-up, hairpins, clips or nail polish.  No use joyas, maquillajes, pinzas/ganchos para el cabello ni esmalte de uas.  Do not wear lotions, powders, or perfumes. You may wear deodorant.  No use lociones, polvos o perfumes.  Puede usar desodorante.    Do not shave 48 hours prior to surgery. Men may shave face and neck.  No se afeite 48 horas antes de la [place].  Los hombres pueden [employer] cara  y el cuello.   Do not bring valuables to the hospital.   No lleve objetos de valor al hospital.  [facility] is not responsible for any belongings or valuables.  Keys no se hace responsable de ningn tipo de pertenencias u objetos de Licensed conveyancer.               Contacts, dentures or bridgework may not be worn into surgery.  Los lentes de [place], las dentaduras  postizas o puentes no se pueden usar en la [place].   Leave your suitcase in the car. After surgery it may be brought to your room.  Deje su maleta en el auto.  Despus de la ciruga podr traerla a su habitacin.   For patients admitted to the hospital, discharge time is determined by your  treatment team.  Para los pacientes que sean ingresados al hospital, el tiempo en el cual se le  dar de alta es determinado por su equipo de [place].   Patients discharged the day of surgery will not be allowed to drive home. A los pacientes que se les da de alta el mismo da de la ciruga no se les permitir conducir a Higher education careers adviser.    ____ Take these medicines the morning of surgery with A SIP OF WATER:          [employer] medicinas la maana de la ciruga con UN SORBO DE AGUA:   1. NONE       __X__ Use CHG Soap as directed (Dial Soap)          Utilice el jabn de CHG segn lo indicado   __X__ Stop Anti-inflammatories on           Deje de tomar antiinflamatorios el da: Tuesday 10/03/20.   __X__ Stop supplements until after surgery            Deje de tomar suplementos hasta despus de la [place]

## 2020-10-10 MED ORDER — ACETAMINOPHEN 500 MG PO TABS: 1000.0000 mg | ORAL_TABLET | ORAL | Status: AC

## 2020-10-10 MED ORDER — CHLORHEXIDINE GLUCONATE CLOTH 2 % EX PADS
6.0000 | MEDICATED_PAD | Freq: Once | CUTANEOUS | Status: AC
Start: 2020-10-10 — End: 2020-10-12
  Administered 2020-10-12: 6 via TOPICAL

## 2020-10-10 MED ORDER — GABAPENTIN 300 MG PO CAPS
300.0000 mg | ORAL_CAPSULE | ORAL | Status: AC
Start: 2020-10-10 — End: 2020-10-11

## 2020-10-10 MED ORDER — ORAL CARE MOUTH RINSE: 15.0000 mL | Freq: Once | OROMUCOSAL | Status: AC

## 2020-10-10 MED ORDER — INDOCYANINE GREEN 25 MG IV SOLR
2.5000 mg | INTRAVENOUS | Status: AC
  Administered 2020-10-12: 2.5 mg via INTRAVENOUS
  Filled 2020-10-10: qty 1

## 2020-10-10 MED ORDER — CEFAZOLIN SODIUM-DEXTROSE 2-4 GM/100ML-% IV SOLN: 2.0000 g | INTRAVENOUS | Status: AC

## 2020-10-10 MED ORDER — CHLORHEXIDINE GLUCONATE CLOTH 2 % EX PADS
6.0000 | MEDICATED_PAD | Freq: Once | CUTANEOUS | Status: AC
  Administered 2020-10-12: 6 via TOPICAL

## 2020-10-10 MED ORDER — CHLORHEXIDINE GLUCONATE 0.12 % MT SOLN: 15.0000 mL | Freq: Once | OROMUCOSAL | Status: AC

## 2020-10-10 MED ORDER — LACTATED RINGERS IV SOLN: INTRAVENOUS | Status: DC

## 2020-10-12 ENCOUNTER — Ambulatory Visit: Payer: Managed Care, Other (non HMO)

## 2020-10-12 ENCOUNTER — Other Ambulatory Visit: Payer: Self-pay

## 2020-10-12 ENCOUNTER — Encounter: Payer: Self-pay | Admitting: Surgery

## 2020-10-12 ENCOUNTER — Encounter
Admission: RE | Disposition: A | Discharge status: Home / Self Care | Payer: Self-pay | Source: Ambulatory Visit | Attending: Surgery

## 2020-10-12 ENCOUNTER — Ambulatory Visit
Admission: RE | Admit: 2020-10-12 | Discharge: 2020-10-12 | Disposition: A | Discharge status: Home / Self Care | Payer: Managed Care, Other (non HMO) | Source: Ambulatory Visit | Attending: Surgery | Admitting: Surgery

## 2020-10-12 DIAGNOSIS — K8012 Calculus of gallbladder with acute and chronic cholecystitis without obstruction: Secondary | ICD-10-CM

## 2020-10-12 DIAGNOSIS — K8020 Calculus of gallbladder without cholecystitis without obstruction: Secondary | ICD-10-CM

## 2020-10-12 DIAGNOSIS — Z79899 Other long term (current) drug therapy: Secondary | ICD-10-CM | POA: Diagnosis not present

## 2020-10-12 DIAGNOSIS — Z833 Family history of diabetes mellitus: Secondary | ICD-10-CM | POA: Insufficient documentation

## 2020-10-12 DIAGNOSIS — K8070 Calculus of gallbladder and bile duct without cholecystitis without obstruction: Secondary | ICD-10-CM | POA: Diagnosis present

## 2020-10-12 DIAGNOSIS — Z791 Long term (current) use of non-steroidal anti-inflammatories (NSAID): Secondary | ICD-10-CM | POA: Insufficient documentation

## 2020-10-12 DIAGNOSIS — Z8249 Family history of ischemic heart disease and other diseases of the circulatory system: Secondary | ICD-10-CM | POA: Insufficient documentation

## 2020-10-12 DIAGNOSIS — K828 Other specified diseases of gallbladder: Secondary | ICD-10-CM | POA: Insufficient documentation

## 2020-10-12 SURGERY — CHOLECYSTECTOMY, ROBOT-ASSISTED, LAPAROSCOPIC
Anesthesia: General

## 2020-10-12 MED ORDER — SUGAMMADEX SODIUM 200 MG/2ML IV SOLN
INTRAVENOUS | Status: DC | PRN
  Administered 2020-10-12: 200 mg via INTRAVENOUS

## 2020-10-12 MED ORDER — PROPOFOL 10 MG/ML IV BOLUS
INTRAVENOUS | Status: DC | PRN
  Administered 2020-10-12: 150 mg via INTRAVENOUS

## 2020-10-12 MED ORDER — ACETAMINOPHEN 500 MG PO TABS: 1000.0000 mg | ORAL_TABLET | Freq: Once | ORAL | Status: AC

## 2020-10-12 MED ORDER — FENTANYL CITRATE (PF) 100 MCG/2ML IJ SOLN
INTRAMUSCULAR | Status: AC
  Filled 2020-10-12: qty 2

## 2020-10-12 MED ORDER — FAMOTIDINE 20 MG PO TABS
ORAL_TABLET | ORAL | Status: AC
  Administered 2020-10-12: 20 mg via ORAL
  Filled 2020-10-12: qty 1

## 2020-10-12 MED ORDER — ROCURONIUM BROMIDE 100 MG/10ML IV SOLN
INTRAVENOUS | Status: DC | PRN
  Administered 2020-10-12: 20 mg via INTRAVENOUS
  Administered 2020-10-12: 50 mg via INTRAVENOUS

## 2020-10-12 MED ORDER — LIDOCAINE HCL (CARDIAC) PF 100 MG/5ML IV SOSY
PREFILLED_SYRINGE | INTRAVENOUS | Status: DC | PRN
  Administered 2020-10-12: 100 mg via INTRAVENOUS

## 2020-10-12 MED ORDER — EPHEDRINE SULFATE 50 MG/ML IJ SOLN
INTRAMUSCULAR | Status: DC | PRN
  Administered 2020-10-12: 10 mg via INTRAVENOUS

## 2020-10-12 MED ORDER — CHLORHEXIDINE GLUCONATE 0.12 % MT SOLN
OROMUCOSAL | Status: AC
  Administered 2020-10-12: 15 mL via OROMUCOSAL
  Filled 2020-10-12: qty 15

## 2020-10-12 MED ORDER — DEXMEDETOMIDINE (PRECEDEX) IN NS 20 MCG/5ML (4 MCG/ML) IV SYRINGE
PREFILLED_SYRINGE | INTRAVENOUS | Status: DC | PRN
  Administered 2020-10-12: 12 ug via INTRAVENOUS

## 2020-10-12 MED ORDER — FAMOTIDINE 20 MG PO TABS: 20.0000 mg | ORAL_TABLET | Freq: Once | ORAL | Status: AC

## 2020-10-12 MED ORDER — SODIUM CHLORIDE FLUSH 0.9 % IV SOLN
INTRAVENOUS | Status: AC
  Filled 2020-10-12: qty 10

## 2020-10-12 MED ORDER — FENTANYL CITRATE (PF) 100 MCG/2ML IJ SOLN
25.0000 ug | INTRAMUSCULAR | Status: DC | PRN
  Administered 2020-10-12 (×2): 25 ug via INTRAVENOUS

## 2020-10-12 MED ORDER — BUPIVACAINE LIPOSOME 1.3 % IJ SUSP
INTRAMUSCULAR | Status: DC | PRN
  Administered 2020-10-12: 20 mL

## 2020-10-12 MED ORDER — DEXAMETHASONE SODIUM PHOSPHATE 10 MG/ML IJ SOLN
INTRAMUSCULAR | Status: DC | PRN
  Administered 2020-10-12: 8 mg via INTRAVENOUS

## 2020-10-12 MED ORDER — PROMETHAZINE HCL 25 MG/ML IJ SOLN: 6.2500 mg | INTRAMUSCULAR | Status: DC | PRN

## 2020-10-12 MED ORDER — BUPIVACAINE LIPOSOME 1.3 % IJ SUSP
INTRAMUSCULAR | Status: AC
  Filled 2020-10-12: qty 20

## 2020-10-12 MED ORDER — MIDAZOLAM HCL 2 MG/2ML IJ SOLN
INTRAMUSCULAR | Status: AC
  Filled 2020-10-12: qty 2

## 2020-10-12 MED ORDER — BUPIVACAINE-EPINEPHRINE (PF) 0.25% -1:200000 IJ SOLN
INTRAMUSCULAR | Status: DC | PRN
  Administered 2020-10-12: 30 mL

## 2020-10-12 MED ORDER — ACETAMINOPHEN 500 MG PO TABS
ORAL_TABLET | ORAL | Status: AC
  Administered 2020-10-12: 1000 mg via ORAL
  Filled 2020-10-12: qty 2

## 2020-10-12 MED ORDER — GABAPENTIN 300 MG PO CAPS
ORAL_CAPSULE | ORAL | Status: AC
  Administered 2020-10-12: 300 mg via ORAL
  Filled 2020-10-12: qty 1

## 2020-10-12 MED ORDER — CEFAZOLIN SODIUM-DEXTROSE 2-4 GM/100ML-% IV SOLN
INTRAVENOUS | Status: AC
  Filled 2020-10-12: qty 100

## 2020-10-12 MED ORDER — DEXAMETHASONE SODIUM PHOSPHATE 10 MG/ML IJ SOLN
INTRAMUSCULAR | Status: AC
  Filled 2020-10-12: qty 1

## 2020-10-12 MED ORDER — ONDANSETRON HCL 4 MG/2ML IJ SOLN
INTRAMUSCULAR | Status: AC
  Filled 2020-10-12: qty 2

## 2020-10-12 MED ORDER — CEFAZOLIN SODIUM-DEXTROSE 2-4 GM/100ML-% IV SOLN
2.0000 g | Freq: Once | INTRAVENOUS | Status: AC
  Administered 2020-10-12: 2 g via INTRAVENOUS

## 2020-10-12 MED ORDER — LIDOCAINE HCL (PF) 2 % IJ SOLN
INTRAMUSCULAR | Status: AC
  Filled 2020-10-12: qty 5

## 2020-10-12 MED ORDER — FENTANYL CITRATE (PF) 100 MCG/2ML IJ SOLN
INTRAMUSCULAR | Status: DC | PRN
  Administered 2020-10-12 (×2): 50 ug via INTRAVENOUS

## 2020-10-12 MED ORDER — IBUPROFEN 600 MG PO TABS
600.0000 mg | ORAL_TABLET | Freq: Three times a day (TID) | ORAL | 1 refills | Status: AC | PRN
Start: 2020-10-12 — End: ?

## 2020-10-12 MED ORDER — HYDROCODONE-ACETAMINOPHEN 5-325 MG PO TABS
ORAL_TABLET | ORAL | Status: AC
  Filled 2020-10-12: qty 1

## 2020-10-12 MED ORDER — HYDROCODONE-ACETAMINOPHEN 5-325 MG PO TABS
1.0000 | ORAL_TABLET | ORAL | Status: DC | PRN
  Administered 2020-10-12: 1 via ORAL

## 2020-10-12 MED ORDER — EPHEDRINE 5 MG/ML INJ
INTRAVENOUS | Status: AC
  Filled 2020-10-12: qty 10

## 2020-10-12 MED ORDER — ONDANSETRON HCL 4 MG/2ML IJ SOLN
INTRAMUSCULAR | Status: DC | PRN
  Administered 2020-10-12: 4 mg via INTRAVENOUS

## 2020-10-12 MED ORDER — OXYCODONE HCL 5 MG PO TABS: 5.0000 mg | ORAL_TABLET | ORAL | 0 refills | Status: DC | PRN

## 2020-10-12 MED ORDER — MIDAZOLAM HCL 2 MG/2ML IJ SOLN
INTRAMUSCULAR | Status: DC | PRN
  Administered 2020-10-12: 2 mg via INTRAVENOUS

## 2020-10-12 MED ORDER — ROCURONIUM BROMIDE 10 MG/ML (PF) SYRINGE
PREFILLED_SYRINGE | INTRAVENOUS | Status: AC
  Filled 2020-10-12: qty 10

## 2020-10-12 MED ORDER — ACETAMINOPHEN 500 MG PO TABS: 1000.0000 mg | ORAL_TABLET | Freq: Four times a day (QID) | ORAL | Status: AC | PRN

## 2020-10-12 MED ORDER — GABAPENTIN 300 MG PO CAPS: 300.0000 mg | ORAL_CAPSULE | Freq: Once | ORAL | Status: AC

## 2020-10-12 SURGICAL SUPPLY — 57 items
ADH SKN CLS APL DERMABOND .7 (GAUZE/BANDAGES/DRESSINGS) ×1
APL PRP STRL LF DISP 70% ISPRP (MISCELLANEOUS) ×1
BAG INFUSER PRESSURE 100CC (MISCELLANEOUS) ×2 IMPLANT
BAG SPEC RTRVL LRG 6X4 10 (ENDOMECHANICALS) ×1
CANNULA REDUC XI 12-8 STAPL (CANNULA) ×1
CANNULA REDUCER 12-8 DVNC XI (CANNULA) ×1 IMPLANT
CHLORAPREP W/TINT 26 (MISCELLANEOUS) ×2 IMPLANT
CLIP VESOLOCK MED LG 6/CT (CLIP) ×4 IMPLANT
COVER WAND RF STERILE (DRAPES) ×2 IMPLANT
CUP MEDICINE 2OZ PLAST GRAD ST (MISCELLANEOUS) IMPLANT
DECANTER SPIKE VIAL GLASS SM (MISCELLANEOUS) IMPLANT
DEFOGGER SCOPE WARMER CLEARIFY (MISCELLANEOUS) ×2 IMPLANT
DERMABOND ADVANCED (GAUZE/BANDAGES/DRESSINGS) ×1
DERMABOND ADVANCED .7 DNX12 (GAUZE/BANDAGES/DRESSINGS) ×1 IMPLANT
DRAPE ARM DVNC X/XI (DISPOSABLE) ×4 IMPLANT
DRAPE COLUMN DVNC XI (DISPOSABLE) ×1 IMPLANT
DRAPE DA VINCI XI ARM (DISPOSABLE) ×4
DRAPE DA VINCI XI COLUMN (DISPOSABLE) ×1
ELECT CAUTERY BLADE TIP 2.5 (TIP) ×2
ELECT REM PT RETURN 9FT ADLT (ELECTROSURGICAL) ×2
ELECTRODE CAUTERY BLDE TIP 2.5 (TIP) ×1 IMPLANT
ELECTRODE REM PT RTRN 9FT ADLT (ELECTROSURGICAL) ×1 IMPLANT
GLOVE SURG SYN 7.0 (GLOVE) ×4 IMPLANT
GLOVE SURG SYN 7.5  E (GLOVE) ×2
GLOVE SURG SYN 7.5 E (GLOVE) ×2 IMPLANT
GOWN STRL REUS W/ TWL LRG LVL3 (GOWN DISPOSABLE) ×4 IMPLANT
GOWN STRL REUS W/TWL LRG LVL3 (GOWN DISPOSABLE) ×8
IRRIGATOR SUCT 8 DISP DVNC XI (IRRIGATION / IRRIGATOR) ×1 IMPLANT
IRRIGATOR SUCTION 8MM XI DISP (IRRIGATION / IRRIGATOR) ×1
IV NS 1000ML (IV SOLUTION) ×2
IV NS 1000ML BAXH (IV SOLUTION) ×1 IMPLANT
KIT PINK PAD W/HEAD ARE REST (MISCELLANEOUS) ×2
KIT PINK PAD W/HEAD ARM REST (MISCELLANEOUS) ×1 IMPLANT
LABEL OR SOLS (LABEL) ×2 IMPLANT
MANIFOLD NEPTUNE II (INSTRUMENTS) ×2 IMPLANT
NEEDLE HYPO 22GX1.5 SAFETY (NEEDLE) ×2 IMPLANT
NS IRRIG 500ML POUR BTL (IV SOLUTION) ×2 IMPLANT
OBTURATOR OPTICAL STANDARD 8MM (TROCAR) ×1
OBTURATOR OPTICAL STND 8 DVNC (TROCAR) ×1
OBTURATOR OPTICALSTD 8 DVNC (TROCAR) ×1 IMPLANT
PACK LAP CHOLECYSTECTOMY (MISCELLANEOUS) ×2 IMPLANT
PENCIL ELECTRO HAND CTR (MISCELLANEOUS) ×2 IMPLANT
POUCH SPECIMEN RETRIEVAL 10MM (ENDOMECHANICALS) ×2 IMPLANT
SEAL CANN UNIV 5-8 DVNC XI (MISCELLANEOUS) ×4 IMPLANT
SEAL XI 5MM-8MM UNIVERSAL (MISCELLANEOUS) ×4
SET TUBE SMOKE EVAC HIGH FLOW (TUBING) ×2 IMPLANT
SOLUTION ELECTROLUBE (MISCELLANEOUS) ×2 IMPLANT
SPONGE LAP 18X18 RF (DISPOSABLE) IMPLANT
SPONGE LAP 4X18 RFD (DISPOSABLE) ×2 IMPLANT
STAPLER CANNULA SEAL DVNC XI (STAPLE) ×1 IMPLANT
STAPLER CANNULA SEAL XI (STAPLE) ×1
SUT MNCRL AB 4-0 PS2 18 (SUTURE) ×2 IMPLANT
SUT VIC AB 3-0 SH 27 (SUTURE) ×2
SUT VIC AB 3-0 SH 27X BRD (SUTURE) ×1 IMPLANT
SUT VICRYL 0 AB UR-6 (SUTURE) ×6 IMPLANT
TAPE TRANSPORE STRL 2 31045 (GAUZE/BANDAGES/DRESSINGS) ×2 IMPLANT
TROCAR BALLN GELPORT 12X130M (ENDOMECHANICALS) ×2 IMPLANT

## 2020-10-12 NOTE — Anesthesia Postprocedure Evaluation (Signed)
Anesthesia Post Note  Patient: Duwayne Heck  Procedure(s) Performed: XI ROBOTIC ASSISTED LAPAROSCOPIC CHOLECYSTECTOMY (N/A ) INDOCYANINE GREEN FLUORESCENCE IMAGING (ICG) (N/A )  Patient location during evaluation: PACU Anesthesia Type: General Level of consciousness: awake and alert and oriented Pain management: pain level controlled Vital Signs Assessment: post-procedure vital signs reviewed and stable Respiratory status: spontaneous breathing Cardiovascular status: blood pressure returned to baseline Anesthetic complications: no   No complications documented.   Last Vitals:  Vitals:   10/12/20 1331 10/12/20 1343  BP: 129/89 124/79  Pulse: 70 73  Resp: 15 11  Temp:  36.4 C  SpO2: 96% 96%    Last Pain:  Vitals:   10/12/20 1343  TempSrc:   PainSc: Asleep                 Ila Landowski

## 2020-10-12 NOTE — Anesthesia Procedure Notes (Signed)
Procedure Name: Intubation Date/Time: 10/12/2020 10:20 AM Performed by: Irving Burton, CRNA Pre-anesthesia Checklist: Patient identified, Emergency Drugs available, Suction available and Patient being monitored Patient Re-evaluated:Patient Re-evaluated prior to induction Oxygen Delivery Method: Circle system utilized Preoxygenation: Pre-oxygenation with 100% oxygen Induction Type: IV induction Ventilation: Mask ventilation without difficulty Laryngoscope Size: McGraph and 4 Grade View: Grade I Tube type: Oral Tube size: 7.5 mm Number of attempts: 1 Airway Equipment and Method: Stylet and Video-laryngoscopy Placement Confirmation: ETT inserted through vocal cords under direct vision,  positive ETCO2 and breath sounds checked- equal and bilateral Secured at: 22 cm Tube secured with: Tape Dental Injury: Teeth and Oropharynx as per pre-operative assessment

## 2020-10-12 NOTE — Discharge Instructions (Signed)
AMBULATORY SURGERY  DISCHARGE INSTRUCTIONS   1) The drugs that you were given will stay in your system until tomorrow so for the next 24 hours you should not:  A) Drive an automobile B) Make any legal decisions C) Drink any alcoholic beverage   2) You may resume regular meals tomorrow.  Today it is better to start with liquids and gradually work up to solid foods.  You may eat anything you prefer, but it is better to start with liquids, then soup and crackers, and gradually work up to solid foods.   3) Please notify your doctor immediately if you have any unusual bleeding, trouble breathing, redness and pain at the surgery site, drainage, fever, or pain not relieved by medication.    4) Additional Instructions:  Keep green band on for 4 days  Please contact your physician with any problems or Same Day Surgery at [phone], Monday through Friday 6 am to 4 pm, or [place] at [place] Main number at [phone]. 

## 2020-10-12 NOTE — Transfer of Care (Signed)
Immediate Anesthesia Transfer of Care Note  Patient: Maxwell Stephens  Procedure(s) Performed: XI ROBOTIC ASSISTED LAPAROSCOPIC CHOLECYSTECTOMY (N/A ) INDOCYANINE GREEN FLUORESCENCE IMAGING (ICG) (N/A )  Patient Location: PACU  Anesthesia Type:General  Level of Consciousness: sedated  Airway & Oxygen Therapy: Patient connected to face mask oxygen  Post-op Assessment: Post -op Vital signs reviewed and stable  Post vital signs: stable  Last Vitals:  Vitals Value Taken Time  BP 114/62   Temp    Pulse 77 10/12/20 1247  Resp 17 10/12/20 1247  SpO2 97 % 10/12/20 1247  Vitals shown include unvalidated device data.  Last Pain:  Vitals:   10/12/20 0925  TempSrc: Temporal         Complications: No complications documented.

## 2020-10-12 NOTE — Interval H&P Note (Signed)
History and Physical Interval Note:  10/12/2020 9:56 AM  Maxwell Stephens  has presented today for surgery, with the diagnosis of cholelithiasis.  The various methods of treatment have been discussed with the patient and family. After consideration of risks, benefits and other options for treatment, the patient has consented to  Procedure(s): XI ROBOTIC ASSISTED LAPAROSCOPIC CHOLECYSTECTOMY (N/A) INDOCYANINE GREEN FLUORESCENCE IMAGING (ICG) (N/A) as a surgical intervention.  The patient's history has been reviewed, patient examined, no change in status, stable for surgery.  I have reviewed the patient's chart and labs.  Questions were answered to the patient's satisfaction.     Coy Rochford

## 2020-10-12 NOTE — Op Note (Signed)
  Procedure Date:  10/12/2020  Pre-operative Diagnosis:  Symptomatic cholelithiasis  Post-operative Diagnosis:  Symptomatic cholelithiasis  Procedure:  Robotic assisted cholecystectomy with ICG FireFly cholangiogram  Surgeon:  Howie Ill, MD  Anesthesia:  General endotracheal  Estimated Blood Loss:  20 ml  Specimens:  gallbladder  Complications:  None  Indications for Procedure:  This is a 40 y.o. male who presents with abdominal pain and workup revealing symptomatic cholelithiasis.  The benefits, complications, treatment options, and expected outcomes were discussed with the patient. The risks of bleeding, infection, recurrence of symptoms, failure to resolve symptoms, bile duct damage, bile duct leak, retained common bile duct stone, bowel injury, and need for further procedures were all discussed with the patient and he was willing to proceed.  Description of Procedure: The patient was correctly identified in the preoperative area and brought into the operating room.  The patient was placed supine with VTE prophylaxis in place.  Appropriate time-outs were performed.  Anesthesia was induced and the patient was intubated.  Appropriate antibiotics were infused.  The abdomen was prepped and draped in a sterile fashion. An infraumbilical incision was made. A cutdown technique was used to enter the abdominal cavity without injury, and a 12 mm robotic port was inserted.  Pneumoperitoneum was obtained with appropriate opening pressures.  Three 8-mm ports were placed in the mid abdomen at the level of the umbilicus under direct visualization.  The DaVinci platform was docked, camera targeted, and instruments were placed under direct visualization.  The gallbladder was identified.  The fundus was grasped and retracted cephalad.  It was noted that the neck of the gallbladder was very distended, and there was a stone stuck at the neck of the gallbladder, causing some mild edema.  Adhesions were  lysed bluntly and with electrocautery. The infundibulum was grasped and retracted laterally, exposing the peritoneum overlying the gallbladder.  This was incised with electrocautery and extended on either side of the gallbladder.  FireFly cholangiogram was then obtained, and we were able to clearly identify the cystic duct and common bile duct.  The cystic duct and both anterior and posterior branches of the cystic artery were carefully dissected with combination of cautery and blunt dissection.  Both were clipped twice proximally and once distally, cutting in between.  The gallbladder was taken from the gallbladder fossa in a retrograde fashion with electrocautery. In doing so, there was some bleeding from the wall of the gallbladder.  The gallbladder was placed in an Endocatch bag. The liver bed was inspected and any bleeding was controlled with electrocautery. The right upper quadrant was then inspected again revealing intact clips, no bleeding, and no ductal injury.  The area was thoroughly irrigated.  The 8 mm ports were removed under direct visualization and the 12 mm port was removed.  The Endocatch bag was brought out via the umbilical incision, and this required extension of the umbilical incision in order to fit the large stone through.  The fascial opening was closed using multiple 0 vicryl sutures.  Local anesthetic was infused in all incisions and the incisions were closed with 4-0 Monocryl.  The wounds were cleaned and sealed with DermaBond.  The patient was emerged from anesthesia and extubated and brought to the recovery room for further management.  The patient tolerated the procedure well and all counts were correct at the end of the case.   Howie Ill, MD

## 2020-10-12 NOTE — Anesthesia Preprocedure Evaluation (Signed)
Anesthesia Evaluation  Patient identified by MRN, date of birth, ID band Patient awake    Reviewed: Allergy & Precautions, H&P , NPO status , Patient's Chart, lab work & pertinent test results, reviewed documented beta blocker date and time   History of Anesthesia Complications Negative for: history of anesthetic complications  Airway Mallampati: II  TM Distance: >3 FB Neck ROM: full    Dental  (+) Dental Advidsory Given, Teeth Intact   Pulmonary neg shortness of breath, neg COPD, Recent URI , Resolved,    Pulmonary exam normal breath sounds clear to auscultation       Cardiovascular Exercise Tolerance: Good negative cardio ROS Normal cardiovascular exam Rhythm:regular Rate:Normal     Neuro/Psych negative neurological ROS  negative psych ROS   GI/Hepatic negative GI ROS, Neg liver ROS,   Endo/Other  negative endocrine ROS  Renal/GU negative Renal ROS  negative genitourinary   Musculoskeletal   Abdominal   Peds  Hematology negative hematology ROS (+)   Anesthesia Other Findings Past Medical History: No date: Cholelithiasis   Reproductive/Obstetrics negative OB ROS                             Anesthesia Physical Anesthesia Plan  ASA: I  Anesthesia Plan: General   Post-op Pain Management:    Induction: Intravenous  PONV Risk Score and Plan: 2 and Ondansetron, Dexamethasone, Midazolam, Promethazine and Treatment may vary due to age or medical condition  Airway Management Planned: Oral ETT  Additional Equipment:   Intra-op Plan:   Post-operative Plan: Extubation in OR  Informed Consent: I have reviewed the patients History and Physical, chart, labs and discussed the procedure including the risks, benefits and alternatives for the proposed anesthesia with the patient or authorized representative who has indicated his/her understanding and acceptance.     Dental Advisory  Given  Plan Discussed with: Anesthesiologist, CRNA and Surgeon  Anesthesia Plan Comments:         Anesthesia Quick Evaluation

## 2020-10-13 LAB — SURGICAL PATHOLOGY

## 2020-10-16 ENCOUNTER — Telehealth: Payer: Self-pay | Admitting: Surgery

## 2020-10-16 NOTE — Telephone Encounter (Signed)
Incoming call from patient.  He had lap chole done on 10/12/20 with Dr. Aleen Campi.  Patient just a little concerned about the surgical area that is red.  He states it is not raised, no drainage, no fever, chills or pain.  Patient states all in all doing very good.  He seems a bit anxious to get back to work and wants to know when this can happen. Please advise.  Thank you.

## 2020-10-16 NOTE — Telephone Encounter (Signed)
Lap cholecystectomy 10/12/20-denies pain- no drainage from incision sites- good bowel movements- patient wants to return to work 10/18/20-we discussed lifting restrictions of no more than 15 pounds for 6 weeks from date of surgery and he stated he does not lift at work. Return to work note placed up front for him to pick up.

## 2020-10-27 ENCOUNTER — Ambulatory Visit (INDEPENDENT_AMBULATORY_CARE_PROVIDER_SITE_OTHER): Payer: Managed Care, Other (non HMO) | Admitting: Surgery

## 2020-10-27 ENCOUNTER — Other Ambulatory Visit: Payer: Self-pay

## 2020-10-27 ENCOUNTER — Encounter: Payer: Self-pay | Admitting: Surgery

## 2020-10-27 VITALS — BP 107/69 | HR 61 | Temp 98.2°F | Ht 65.0 in | Wt 162.0 lb

## 2020-10-27 DIAGNOSIS — Z09 Encounter for follow-up examination after completed treatment for conditions other than malignant neoplasm: Secondary | ICD-10-CM

## 2020-10-27 DIAGNOSIS — K8020 Calculus of gallbladder without cholecystitis without obstruction: Secondary | ICD-10-CM

## 2020-10-27 NOTE — Patient Instructions (Addendum)
Follow-up with our office as needed.  Please call and ask to speak with a nurse if you develop questions or concerns.   GENERAL POST-OPERATIVE PATIENT INSTRUCTIONS   WOUND CARE INSTRUCTIONS: Try to keep the wound dry and avoid ointments on the wound unless directed to do so.  If the wound becomes bright red and painful or starts to drain infected material that is not clear, please contact your physician immediately.  If the wound is mildly pink and has a thick firm ridge underneath it, this is normal, and is referred to as a healing ridge.  This will resolve over the next 4-6 weeks.  BATHING: You may shower if you have been informed of this by your surgeon. However, Please do not submerge in a tub, hot tub, or pool until incisions are completely sealed or have been told by your surgeon that you may do so.  DIET:  You may eat any foods that you can tolerate.  It is a good idea to eat a high fiber diet and take in plenty of fluids to prevent constipation.  If you do become constipated you may want to take a mild laxative or take ducolax tablets on a daily basis until your bowel habits are regular.  Constipation can be very uncomfortable, along with straining, after recent surgery.  ACTIVITY: You may want to hug a pillow when coughing and sneezing to add additional support to the surgical area, if you had abdominal or chest surgery, which will decrease pain during these times.  You are encouraged to walk and engage in light activity for the next two weeks.  You should not lift more than 20 pounds for 4-6 weeks after surgery as it could put you at increased risk for complications.  Twenty pounds is roughly equivalent to a plastic bag of groceries. At that time- Listen to your body when lifting, if you have pain when lifting, stop and then try again in a few days. Soreness after doing exercises or activities of daily living is normal as you get back in to your normal routine.  MEDICATIONS:  Try to take  narcotic medications and anti-inflammatory medications, such as tylenol, ibuprofen, naprosyn, etc., with food.  This will minimize stomach upset from the medication.  Should you develop nausea and vomiting from the pain medication, or develop a rash, please discontinue the medication and contact your physician.  You should not drive, make important decisions, or operate machinery when taking narcotic pain medication.  SUNBLOCK Use sun block to incision area over the next year if this area will be exposed to sun. This helps decrease scarring and will allow you avoid a permanent darkened area over your incision.  QUESTIONS:  Please feel free to call our office if you have any questions, and we will be glad to assist you.

## 2020-10-27 NOTE — Progress Notes (Signed)
10/27/2020  HPI: Maxwell Stephens is a 40 y.o. male s/p robotic assisted cholecystectomy on 10/12/20.  Presents for follow up.  Reports some initial post-op issues with discomfort at the incisions and bruising, but denies any nausea, vomiting, or other issues with po intake.  Vital signs: BP 107/69   Pulse 61   Temp 98.2 F (36.8 C)   Ht 5\' 5"  (1.651 m)   Wt 162 lb (73.5 kg)   SpO2 95%   BMI 26.96 kg/m    Physical Exam: Constitutional: No acute distress Abdomen:  Soft, non-distended, non-tender to palpation.  Incisions healing well, clean, dry, intact.    Assessment/Plan: This is a 40 y.o. male s/p robotic assisted cholecystectomy.  - Discussed with the patient that he is healing well with expected postoperative course.  His incisions are healing well without any evidence of infection.  Reminded him of still no heavy lifting or pushing for a total of 4 weeks. - Follow-up as needed.   45, MD [facility]

## 2021-03-25 IMAGING — US US ABDOMEN LIMITED
1 series · 14 of 25 positions shown · non-contrast
Comparison: 04/16/2020

CLINICAL DATA: Right-sided abdominal pain for 1 day.

EXAM:
ULTRASOUND ABDOMEN LIMITED RIGHT UPPER QUADRANT

[Series 1: us abdomen limited ruq (liver/gb) · 14 of 34 slices shown]
[im 1/34]
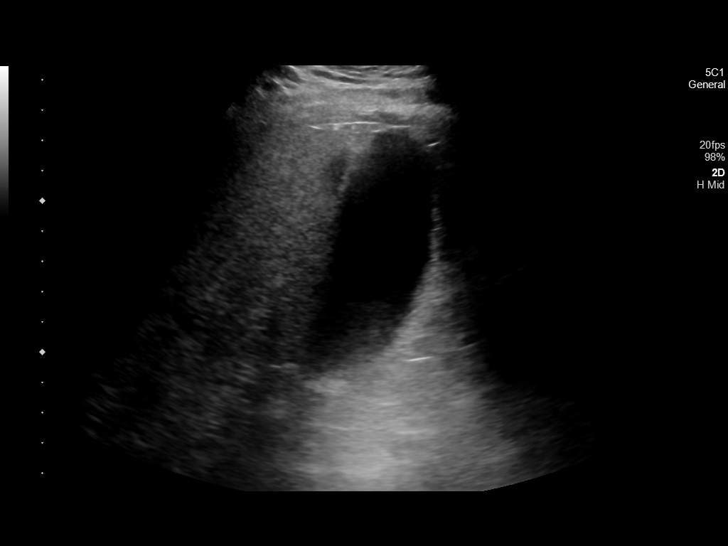
[im 3/34]
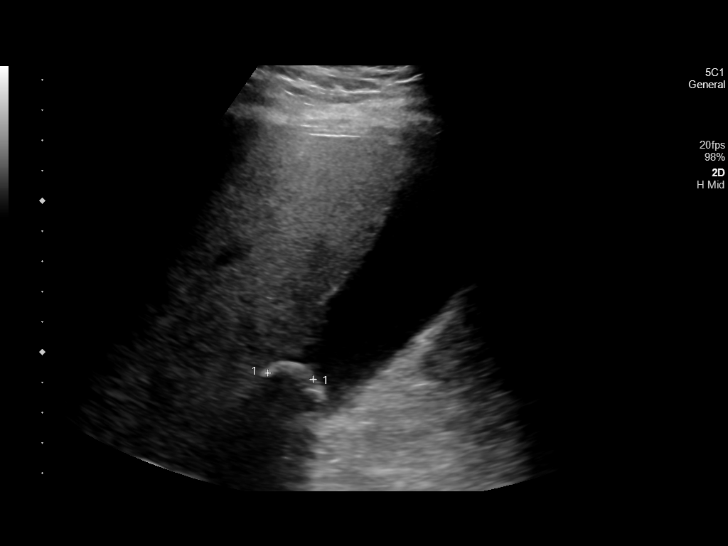
[im 6/34]
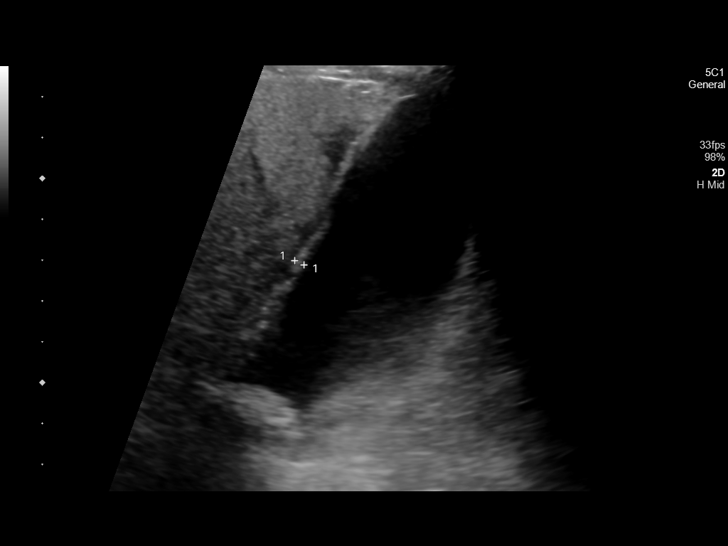
[im 9/34]
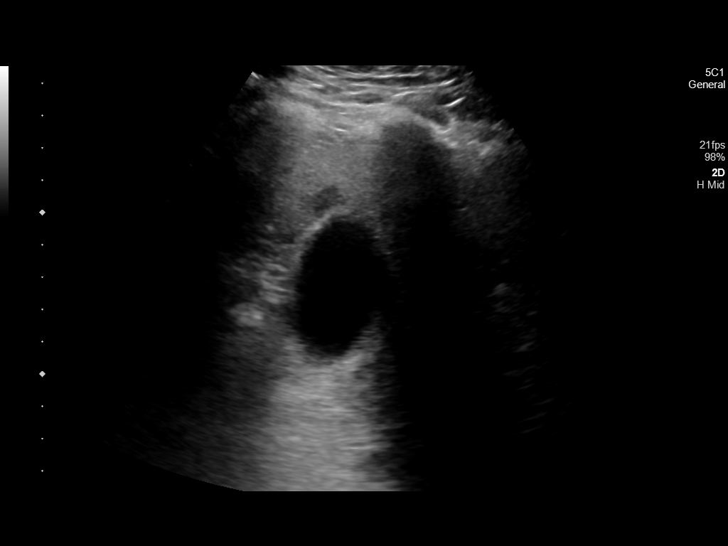
[im 12/34]
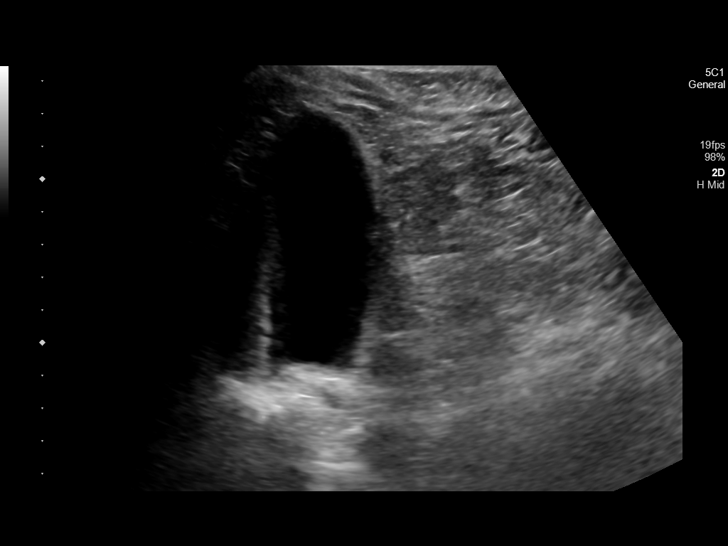
[im 13/34]
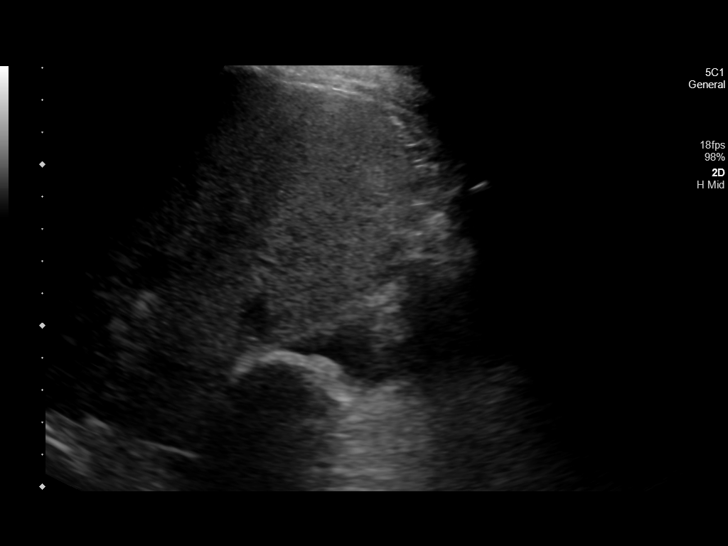
[im 16/34]
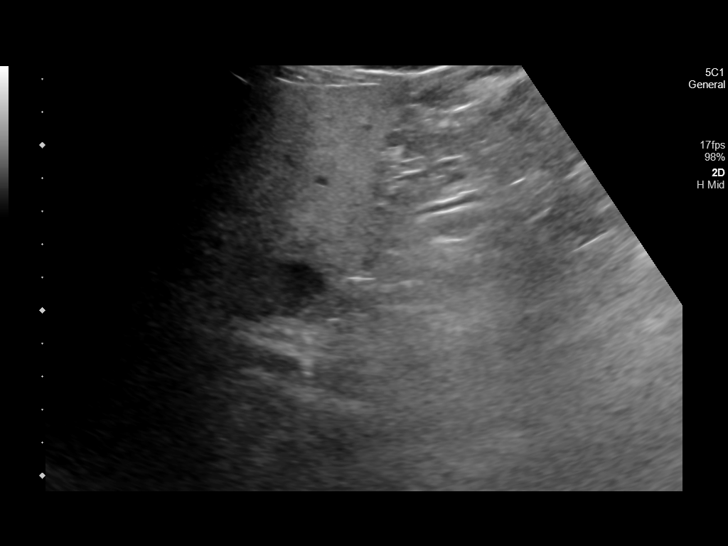
[im 18/34]
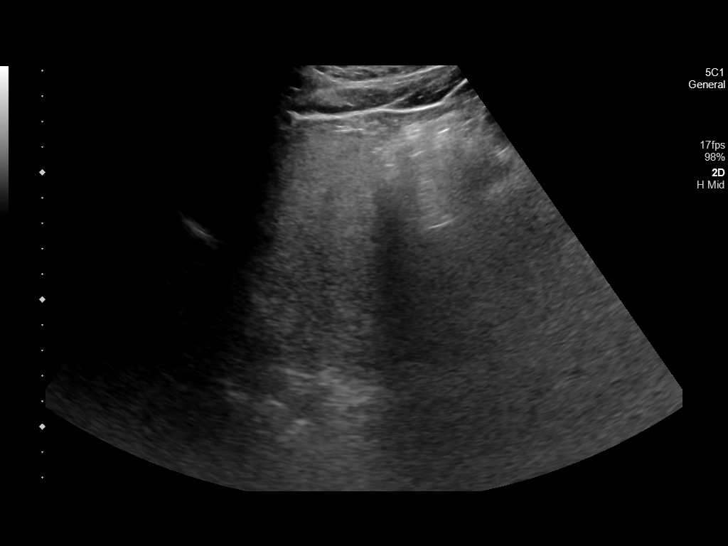
[im 21/34]
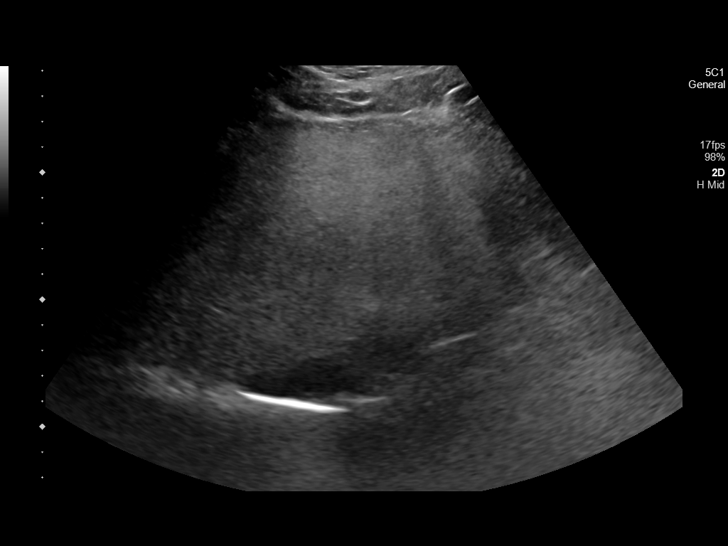
[im 23/34]
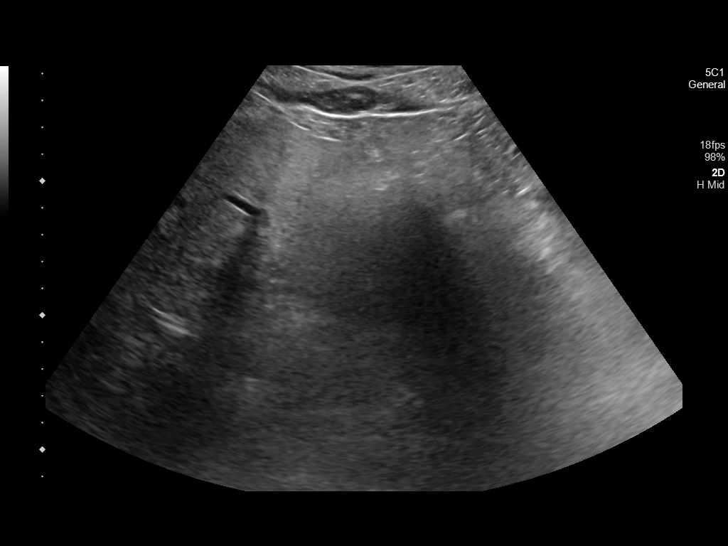
[im 25/34]
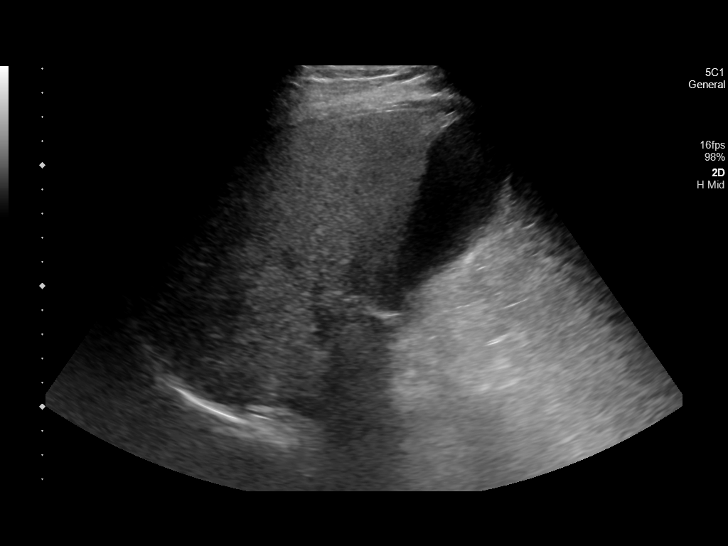
[im 28/34]
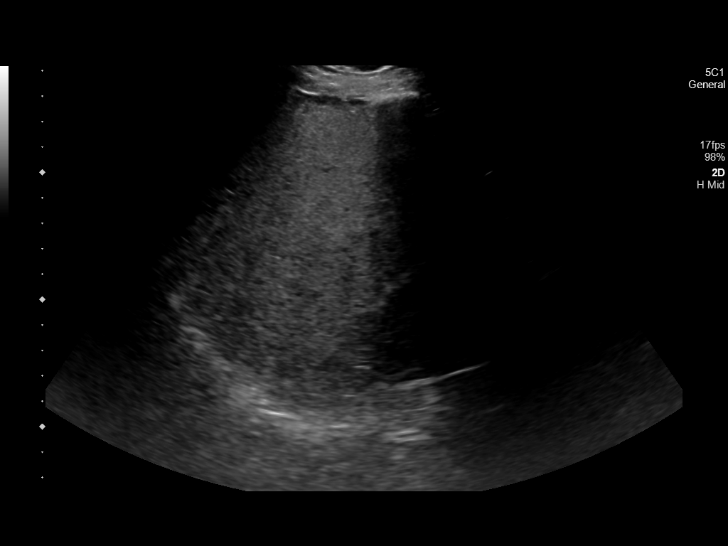
[im 31/34]
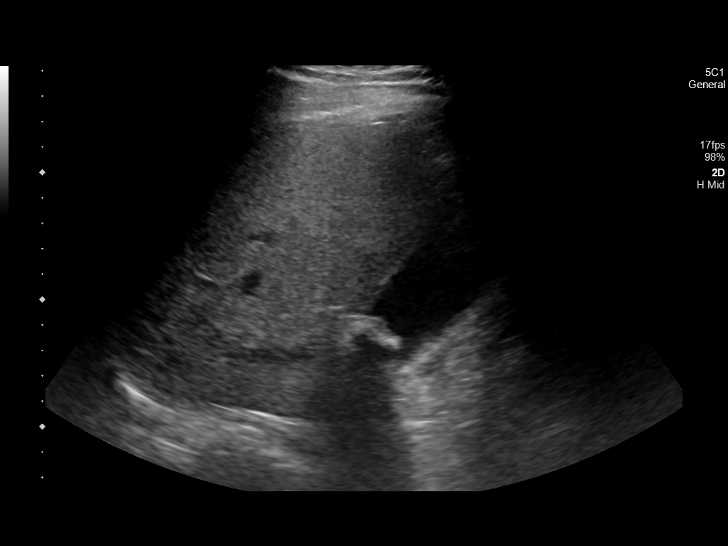
[im 34/34]
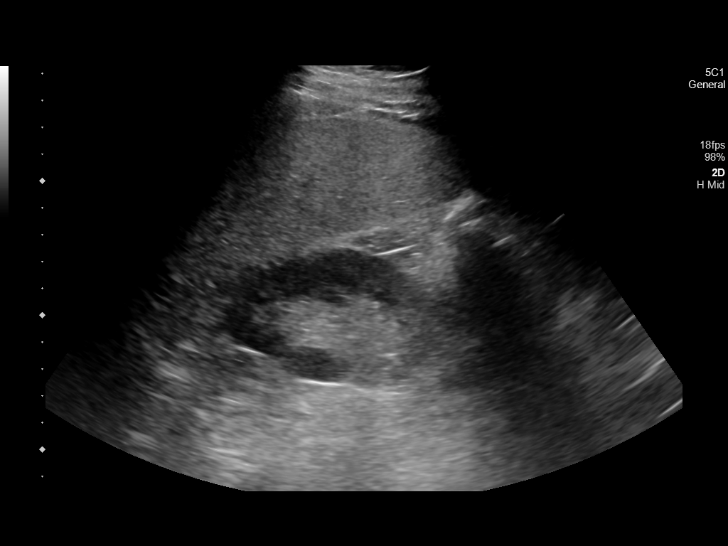

[14 of 25 positions shown; findings below may reference images not displayed]

FINDINGS: Gallbladder:

Shadowing gallstones noted in the region of the gallbladder neck,
measuring up to 15 mm size. No gallbladder wall thickening or
pericholecystic fluid. The sonographer reports no sonographic Murphy
sign.

Common bile duct:

Diameter: 3-4 mm

Liver:

Coarsening of the echotexture suggests underlying fatty deposition.
Portal vein is patent on color Doppler imaging with normal direction
of blood flow towards the liver.

Other: None.
IMPRESSION: Cholelithiasis.  No sonographic findings of acute cholecystitis.

## 2021-08-13 ENCOUNTER — Other Ambulatory Visit: Payer: Self-pay | Admitting: Family Medicine

## 2021-08-13 ENCOUNTER — Ambulatory Visit
Admission: RE | Admit: 2021-08-13 | Discharge: 2021-08-13 | Disposition: A | Discharge status: Home / Self Care | Payer: BC Managed Care – PPO | Attending: Family Medicine | Admitting: Family Medicine

## 2021-08-13 ENCOUNTER — Other Ambulatory Visit: Payer: Self-pay

## 2021-08-13 ENCOUNTER — Ambulatory Visit
Admission: RE | Admit: 2021-08-13 | Discharge: 2021-08-13 | Disposition: A | Discharge status: Home / Self Care | Payer: BC Managed Care – PPO | Source: Ambulatory Visit | Attending: Family Medicine | Admitting: Family Medicine

## 2021-08-13 DIAGNOSIS — A159 Respiratory tuberculosis unspecified: Secondary | ICD-10-CM | POA: Insufficient documentation

## 2022-06-03 IMAGING — CR DG CHEST 2V
1 series · 2 of 2 positions shown · non-contrast
Comparison: None.

CLINICAL DATA: Positive PPD.

EXAM:
CHEST - 2 VIEW

[Series 1: dg chest 2 view · 0.14mm/px · 2 of 2 slices shown]
[im 1/2]
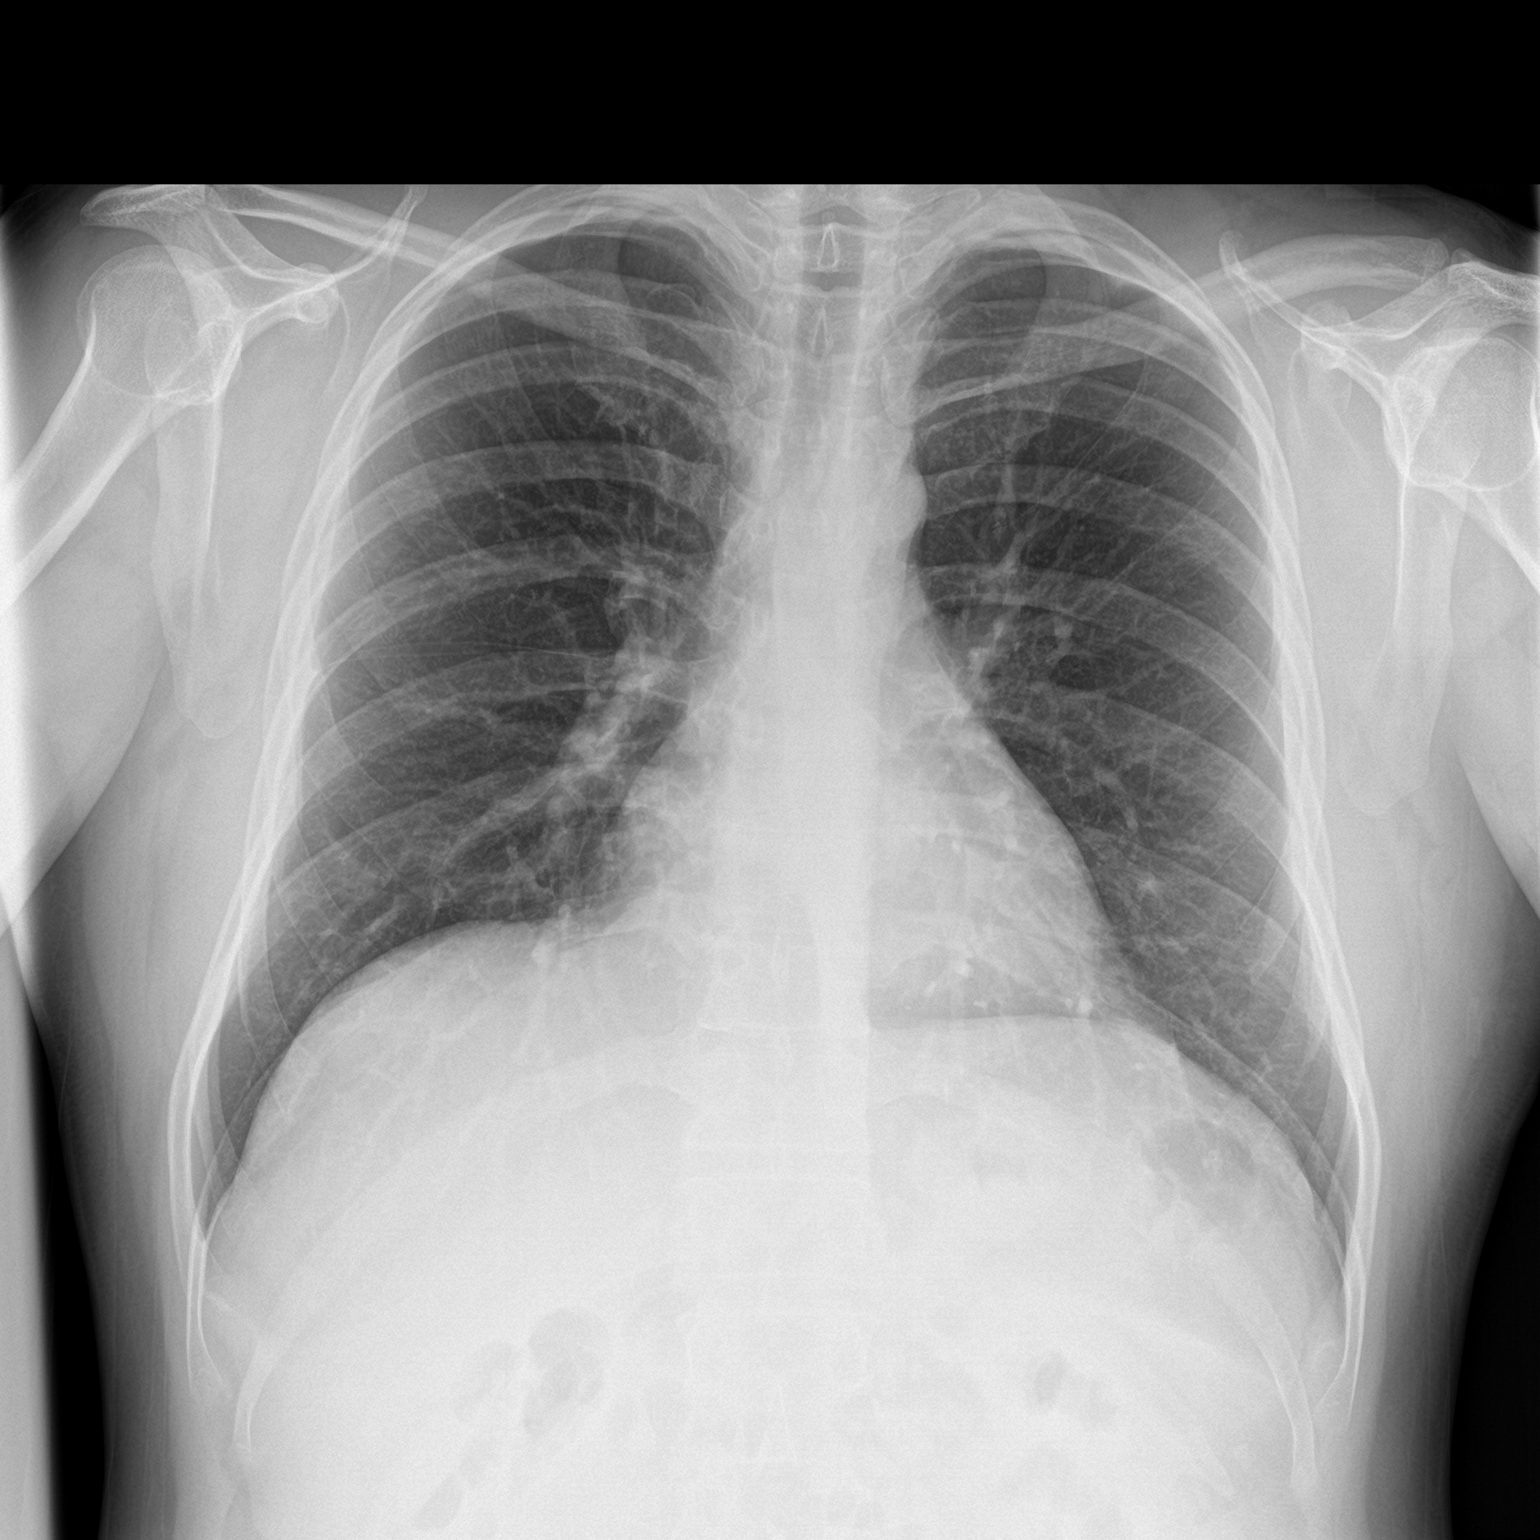
[im 2/2]
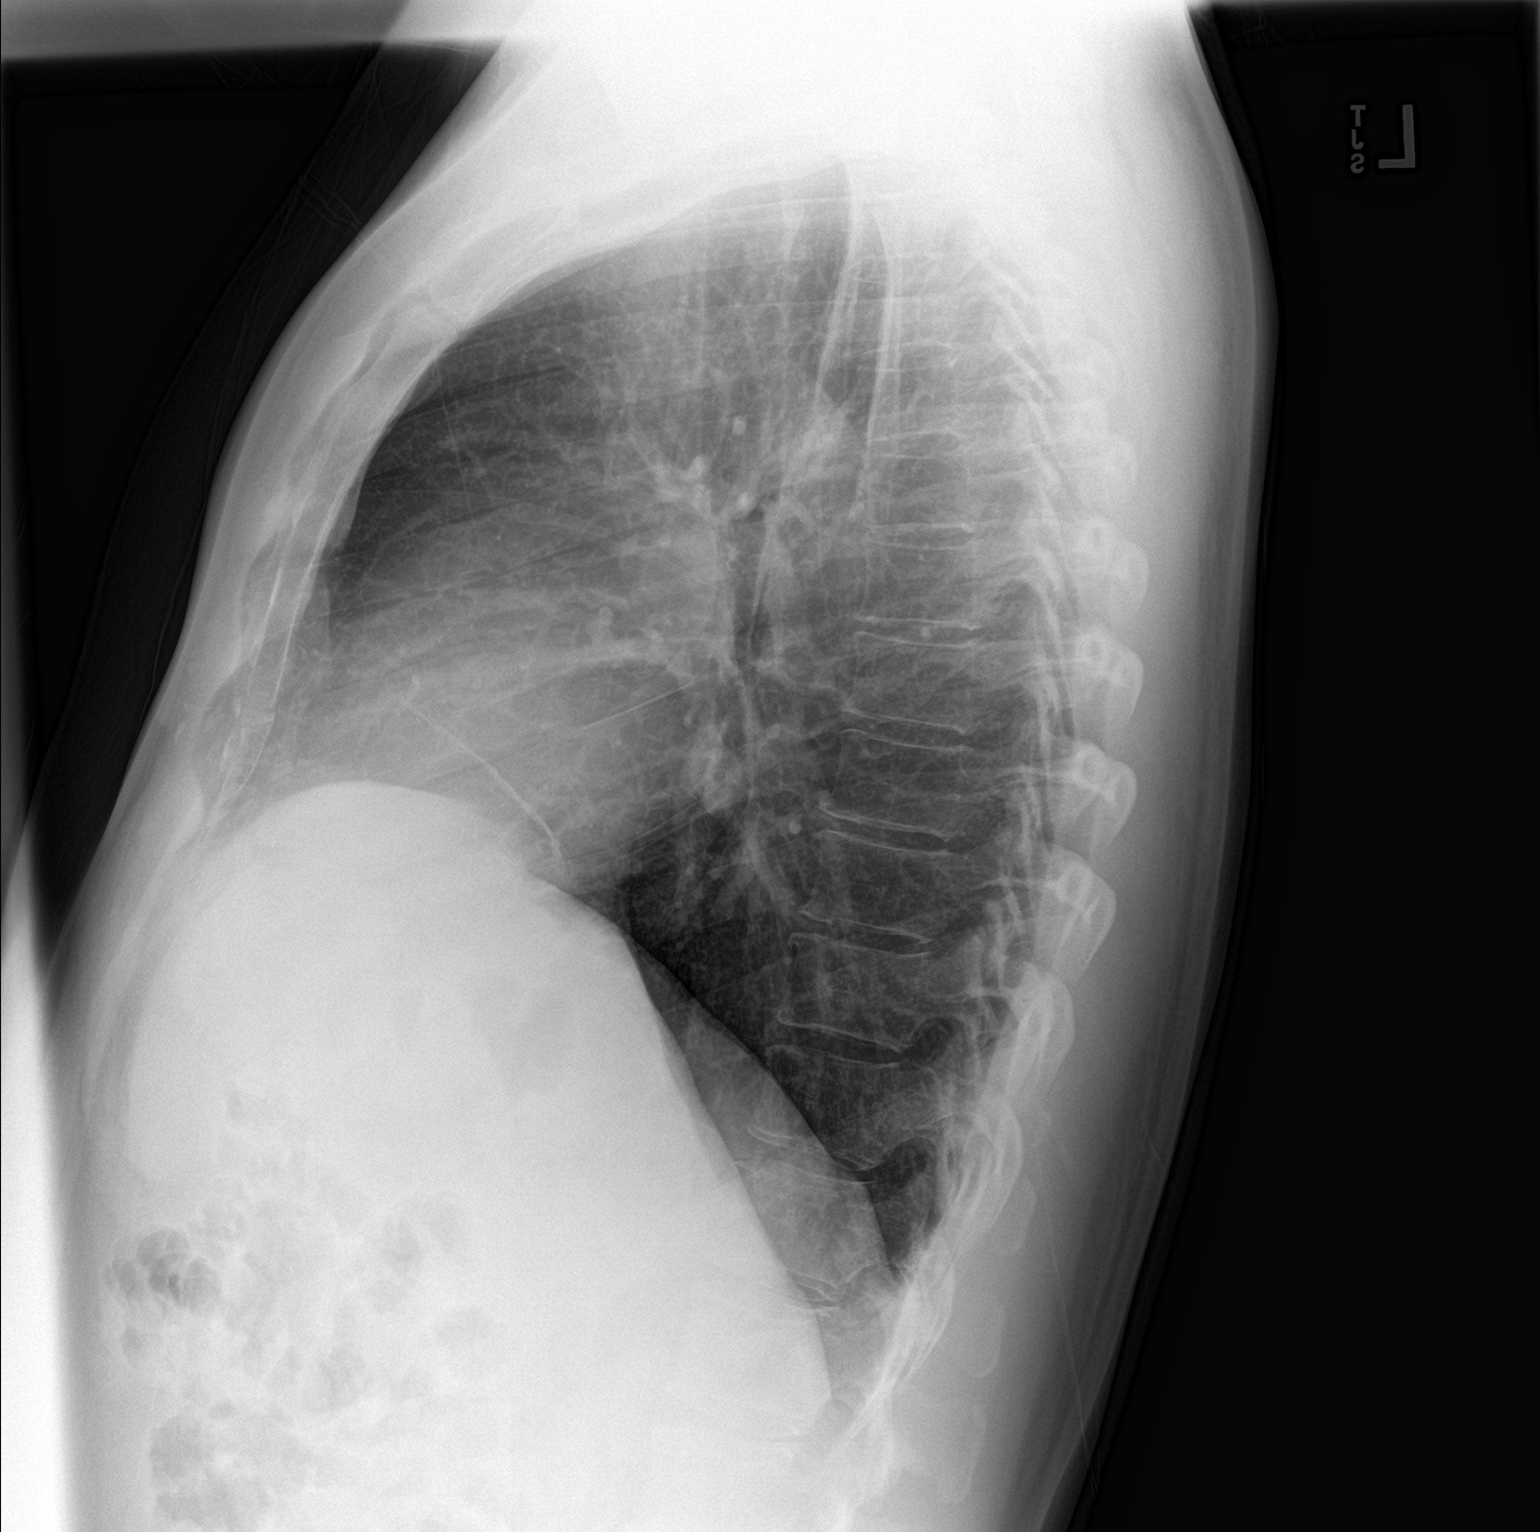

[2 of 2 positions shown; findings below may reference images not displayed]

FINDINGS: The heart size and mediastinal contours are within normal limits.
Both lungs are clear. The visualized skeletal structures are
unremarkable.
IMPRESSION: No active cardiopulmonary disease.
